# Patient Record
Sex: Female | Born: 1990 | Race: White | Hispanic: Yes | Marital: Single | Smoking: Current every day smoker
Health system: Southern US, Community
[De-identification: ages and names within clinical notes are randomized; demographics above are authoritative.]

---
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
## 2010-12-27 ENCOUNTER — Inpatient Hospital Stay (INDEPENDENT_AMBULATORY_CARE_PROVIDER_SITE_OTHER)
Admission: RE | Admit: 2010-12-27 | Discharge: 2010-12-27 | Disposition: A | Discharge status: Home / Self Care | Payer: Self-pay | Source: Ambulatory Visit | Attending: Family Medicine | Admitting: Family Medicine

## 2010-12-27 DIAGNOSIS — N390 Urinary tract infection, site not specified: Secondary | ICD-10-CM

## 2010-12-27 DIAGNOSIS — F341 Dysthymic disorder: Secondary | ICD-10-CM

## 2010-12-27 LAB — POCT URINALYSIS DIPSTICK
Nitrite: NEGATIVE
Protein, ur: NEGATIVE mg/dL
Specific Gravity, Urine: 1.02 (ref 1.005–1.030)
Urobilinogen, UA: 0.2 mg/dL (ref 0.0–1.0)

## 2011-03-02 NOTE — Discharge Summary (Signed)
Kristen Li, Kristen Li NO.:  [id number]   MEDICAL RECORD NO.:  [id number]          PATIENT TYPE:  INP   LOCATION:  9107                          FACILITY:  [facility]   PHYSICIAN:  Lesly Dukes, M.D. DATE OF BIRTH:  May 20, 1991   DATE OF ADMISSION:  07/25/2006  DATE OF DISCHARGE:  07/27/2006                                 DISCHARGE SUMMARY   ADMISSION DIAGNOSES:  1. A G2, P0, 0-0-1-0 at 37-2 weeks with twin gestation admitted for      induction of labor.  2. Elevated blood pressures.   HOSPITAL COURSE:  Patient was admitted, a 20 year old G2, P0 0-0-1-0 at 37/2  weeks with a twin gestation, vertex breech presentation.  On admission, her  cervical exam was 4-5 cm, 90% and -1.  Blood pressures on admission were in  the upper 130s systolic over 80s-90s diastolic, and PIH labs were drawn.  Creatinine 0.6 is normal.  Alk phos was a little high at 147.  LDH was 159.  Uric acid 6.  CBC showed hemoglobin of 10.9, hematocrit 31.5, platelets 207.   Patient was given an epidural, and a decision was made to start magnesium  sulfate due to her high blood pressures and elevated uric acid.  She was  induced with Pitocin, low dose.  AROM was performed as well.  The patient  delivered on October 11th twins, first via a normal spontaneous vaginal  delivery at 10:49 a.m.  Apgars 9 at one and 9 at five minutes.  Baby B  delivered at 11:00, Apgars 5 and 9.  Placenta was intact.  Two, three vessel  cord.  First degree perineal laceration, which was repaired with 3-0 Vicryl,  with good hemostasis.  EBL was approximately 500 ml.  Fundus was firm at the  time of finishing with patient.  The patient and the babies were stable, and  the cord gas was sent on baby B and was found to be 7.23.   Magnesium was discontinued approximately 24 hours post partum.  She had  great urine output, and no signs or symptoms of preeclampsia.  She had a  normal post-partum course.  Blood pressure at the time  of discharge was  124/76.  Patient is bottle-feeding both of her children.  A prescription was  given for Ortho Tri-Cyclen.  Patient was Rubella immune and O+.  Did not  require RhoGAM or Rubella vaccine.   Social work saw her since she had a teenage pregnancy and felt like her mom  was very supportive, and there was no barrier to discharge anticipated.   Prenatal labs on her [place] include patient is O+, antibody screen  negative, Rubella immune, hepatitis B surface antigen negative, RPR  nonreactive, gonorrhea negative.  Chlamydia was positive but treated in  August.  Discharge hemoglobin and hematocrit on October 12th, hemoglobin  10.9, hematocrit 31.5.  Patient had an RPR that was nonreactive.  Her PIH  panel was done only one time and was recorded in the hospital course above.  The patient was also GBS negative by her previous records.  At the time of discharge, the patient is stable and in good condition.   DISCHARGE DIAGNOSES:  1. Patient is a 20 year old G2, P2, 0-1-2, postpartum day #2 with an      normal spontaneous vaginal delivery for twin #1 and a breech extraction      for twin #2.  2. Elevated blood pressure.  3. Twin gestation.  4. First-degree laceration requiring repair.   Discharge medications include Colace, Ortho Tri-Cyclen, Dermaplast spray,  prenatal vitamins.   INSTRUCTIONS:  Patient is to not have sex for six weeks.  She is to follow  up at [facility] in six weeks for her postpartum appointment.     ______________________________  Alanda Amass, M.D.    ______________________________  Lesly Dukes, M.D.    JH/MEDQ  D:  08/01/2006  T:  08/02/2006  Job:  [id number]
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
## 2011-03-16 ENCOUNTER — Emergency Department (HOSPITAL_COMMUNITY)
Admission: EM | Admit: 2011-03-16 | Discharge: 2011-03-16 | Disposition: A | Discharge status: Home / Self Care | Payer: Self-pay | Attending: Emergency Medicine | Admitting: Emergency Medicine

## 2011-03-16 DIAGNOSIS — K089 Disorder of teeth and supporting structures, unspecified: Secondary | ICD-10-CM | POA: Insufficient documentation

## 2011-03-16 DIAGNOSIS — K029 Dental caries, unspecified: Secondary | ICD-10-CM | POA: Insufficient documentation

## 2011-04-30 ENCOUNTER — Emergency Department (HOSPITAL_COMMUNITY)
Admission: EM | Admit: 2011-04-30 | Discharge: 2011-05-01 | Disposition: A | Discharge status: Home / Self Care | Payer: Self-pay | Attending: Emergency Medicine | Admitting: Emergency Medicine

## 2011-04-30 DIAGNOSIS — T7840XA Allergy, unspecified, initial encounter: Secondary | ICD-10-CM | POA: Insufficient documentation

## 2011-04-30 DIAGNOSIS — I10 Essential (primary) hypertension: Secondary | ICD-10-CM | POA: Insufficient documentation

## 2011-04-30 DIAGNOSIS — R21 Rash and other nonspecific skin eruption: Secondary | ICD-10-CM | POA: Insufficient documentation

## 2011-07-11 LAB — COMPREHENSIVE METABOLIC PANEL
Albumin: 3.3 — ABNORMAL LOW
BUN: 11
Calcium: 9
Creatinine, Ser: 0.84
Total Protein: 6.2

## 2011-07-11 LAB — DIFFERENTIAL
Basophils Absolute: 0.1
Lymphocytes Relative: 38
Lymphs Abs: 1.8
Monocytes Absolute: 0.3
Monocytes Relative: 6
Neutro Abs: 2.4

## 2011-07-11 LAB — CBC
HCT: 38.4
MCV: 83.1
Platelets: 238
RDW: 13.5

## 2011-07-26 LAB — CBC
HCT: 35 — ABNORMAL LOW
Hemoglobin: 12.3
MCHC: 34.5
MCHC: 35.2
MCV: 86.6
RBC: 3.58 — ABNORMAL LOW
RDW: 14.4 — ABNORMAL HIGH

## 2011-07-27 LAB — URINALYSIS, ROUTINE W REFLEX MICROSCOPIC
Hgb urine dipstick: NEGATIVE
Nitrite: NEGATIVE
Specific Gravity, Urine: <1.005 — ABNORMAL LOW
Urobilinogen, UA: 0.2

## 2011-07-27 LAB — WET PREP, GENITAL: Clue Cells Wet Prep HPF POC: NONE SEEN

## 2011-07-27 LAB — URINE MICROSCOPIC-ADD ON

## 2011-07-27 LAB — GC/CHLAMYDIA PROBE AMP, GENITAL: GC Probe Amp, Genital: NEGATIVE

## 2012-05-29 ENCOUNTER — Emergency Department (INDEPENDENT_AMBULATORY_CARE_PROVIDER_SITE_OTHER)
Admission: EM | Admit: 2012-05-29 | Discharge: 2012-05-29 | Disposition: A | Discharge status: Home / Self Care | Payer: Self-pay | Source: Home / Self Care | Attending: Family Medicine | Admitting: Family Medicine

## 2012-05-29 ENCOUNTER — Encounter (HOSPITAL_COMMUNITY): Payer: Self-pay | Admitting: *Deleted

## 2012-05-29 DIAGNOSIS — M7750 Other enthesopathy of unspecified foot: Secondary | ICD-10-CM

## 2012-05-29 DIAGNOSIS — M722 Plantar fascial fibromatosis: Secondary | ICD-10-CM

## 2012-05-29 DIAGNOSIS — M7742 Metatarsalgia, left foot: Secondary | ICD-10-CM

## 2012-05-29 MED ORDER — CYCLOBENZAPRINE HCL 10 MG PO TABS: 10.0000 mg | ORAL_TABLET | Freq: Two times a day (BID) | ORAL | Status: AC | PRN

## 2012-05-29 MED ORDER — PREDNISONE 20 MG PO TABS: ORAL_TABLET | ORAL | Status: AC

## 2012-05-29 MED ORDER — NAPROXEN 500 MG PO TABS: 500.0000 mg | ORAL_TABLET | Freq: Two times a day (BID) | ORAL | Status: DC

## 2012-05-29 MED ORDER — TRAMADOL HCL 50 MG PO TABS: 50.0000 mg | ORAL_TABLET | Freq: Four times a day (QID) | ORAL | Status: AC | PRN

## 2012-05-29 NOTE — ED Provider Notes (Signed)
History     CSN: [id number]  Arrival date & time 05/29/12  1429   First MD Initiated Contact with Patient 05/29/12 1446      Chief Complaint  Patient presents with  . Foot Pain    (Consider location/radiation/quality/duration/timing/severity/associated sxs/prior treatment) HPI Comments: 21 year old female smoker with no significant past medical history here complaining of left foot pain for one week. She works as a Conservation officer, nature at [employer] and has to be standing and walking on concrete floor for most time during the day. Denies recent known injury or falls. Pain is worsening mostly on the anterior sole, throbbing also present in morning when waking up.   History reviewed. No pertinent past medical history.  History reviewed. No pertinent past surgical history.  Family History  Problem Relation Age of Onset  . Family history unknown: Yes    History  Substance Use Topics  . Smoking status: Current Everyday Smoker -- 0.5 packs/day    Types: Cigarettes  . Smokeless tobacco: Not on file  . Alcohol Use: Yes     socially    OB History    Grav Para Term Preterm Abortions TAB SAB Ect Mult Living                  Review of Systems  Constitutional:       10 systems reviewed and  pertinent negative and positive symptoms are as per HPI.     Musculoskeletal:       Left foot pain As per HPI  All other systems reviewed and are negative.    Allergies  Review of patient's allergies indicates no known allergies.  Home Medications   Current Outpatient Rx  Name Route Sig Dispense Refill  . CYCLOBENZAPRINE HCL 10 MG PO TABS Oral Take 1 tablet (10 mg total) by mouth 2 (two) times daily as needed. 20 tablet 0  . NAPROXEN 500 MG PO TABS Oral Take 1 tablet (500 mg total) by mouth 2 (two) times daily with a meal. 30 tablet 0  . PREDNISONE 20 MG PO TABS  2 tabs po daily for 5 days 10 tablet 0  . TRAMADOL HCL 50 MG PO TABS Oral Take 1 tablet (50 mg total) by mouth every 6 (six) hours as  needed for pain. 20 tablet 0    BP 127/85  Pulse 90  Temp 98.9 F (37.2 C) (Oral)  Resp 16  SpO2 98%  LMP 05/19/2012  Physical Exam  Nursing note and vitals reviewed. Constitutional: She is oriented to person, place, and time. She appears well-developed and well-nourished. No distress.  HENT:  Head: Normocephalic and atraumatic.  Eyes: Conjunctivae are normal.  Cardiovascular: Normal heart sounds.   Pulmonary/Chest: Breath sounds normal.  Musculoskeletal:       Left foot: plantar calluses in heels, 1st MPJ and big toe pad. diffused tenderness to palpation with no focalization in metatarsal area and mid and lateral sole anterior to heel. Pain worse with toes flexion. No bruising, swelling, skin abrasions, puncture wounds or lacerations.   Neurological: She is alert and oriented to person, place, and time.    ED Course  Procedures (including critical care time)  Labs Reviewed - No data to display No results found.   1. Metatarsalgia of left foot   2. Plantar fasciitis of left foot       MDM  Treated with tramadol, naproxen, prednisone and Flexeril. Placed on a rigid shoe and crutches. Work note for modified duty. Asked to followup with  sports medicine/orthopedic specialist if worsening symptoms or if not improvement after completing treatment.        Sharin Grave, MD 05/31/12 1520

## 2012-05-29 NOTE — ED Notes (Signed)
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

## 2012-07-22 ENCOUNTER — Encounter (HOSPITAL_COMMUNITY): Payer: Self-pay | Admitting: Physical Medicine and Rehabilitation

## 2012-07-22 ENCOUNTER — Emergency Department (HOSPITAL_COMMUNITY)
Admission: EM | Admit: 2012-07-22 | Discharge: 2012-07-22 | Disposition: A | Discharge status: Home / Self Care | Payer: BC Managed Care – PPO | Attending: Emergency Medicine | Admitting: Emergency Medicine

## 2012-07-22 DIAGNOSIS — M722 Plantar fascial fibromatosis: Secondary | ICD-10-CM | POA: Insufficient documentation

## 2012-07-22 DIAGNOSIS — F17200 Nicotine dependence, unspecified, uncomplicated: Secondary | ICD-10-CM | POA: Insufficient documentation

## 2012-07-22 MED ORDER — IBUPROFEN 800 MG PO TABS: 800.0000 mg | ORAL_TABLET | Freq: Three times a day (TID) | ORAL | Status: DC | PRN

## 2012-07-22 NOTE — ED Notes (Signed)
Pt presents to department for evaluation of L foot pain. Ongoing x3 months. Pt states no relief with medications at home. States pain to bottom of L foot with ambulation. 7/10 at the time. Pt is alert and oriented x4. No signs of distress noted.

## 2012-07-22 NOTE — Progress Notes (Signed)
Orthopedic Tech Progress Note Patient Details:  Kristen Li 10-Dec-1990 [id number] Post. Op shoe applied to Left foot. Patient tolerated well. Family present in room with patient. Ortho Devices Type of Ortho Device: Postop boot Ortho Device/Splint Location: Left LE Ortho Device/Splint Interventions: Application   Asia R Thompson 07/22/2012, 11:18 AM

## 2012-07-22 NOTE — ED Notes (Signed)
Ortho tech paged for ortho boot

## 2012-07-22 NOTE — ED Provider Notes (Signed)
History     CSN: [id number]  Arrival date & time 07/22/12  4098   First MD Initiated Contact with Patient 07/22/12 1015      Chief Complaint  Patient presents with  . Foot Pain    (Consider location/radiation/quality/duration/timing/severity/associated sxs/prior treatment) HPI Comments: Patient reports she has had three months of pain diffusely over the plantar aspect of her left foot.  Pain is described as throbbing, is worse with standing and walking.  Radiates into her left calf.  She was seen in August by [facility] and was diagnosed with plantar fasciitis.  Used a walking boot and crutches for a short period of time.  She believes that the boot helped her pain.  Also used frozen water bottle under her foot which also helped with the pain. Pain is also alleviated by resting the foot.  Denies any weakness of numbness of the foot, fevers, chest pain, SOB.  Denies any injury to her foot.  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  She has changed the kind of shoes she wears at work.  Is currently taking no medication and doing no treatments or stretching for her pain.  States the post-op shoe fell apart.      Patient is a 21 y.o. female presenting with lower extremity pain. The history is provided by the patient.  Foot Pain Pertinent negatives include no numbness or weakness.    No past medical history on file.  No past surgical history on file.  No family history on file.  History  Substance Use Topics  . Smoking status: Current Every Day Smoker -- 0.5 packs/day    Types: Cigarettes  . Smokeless tobacco: Not on file  . Alcohol Use: Yes     socially    OB History    Grav Para Term Preterm Abortions TAB SAB Ect Mult Living                  Review of Systems  Skin: Negative for wound.  Neurological: Negative for weakness and numbness.    Allergies  Review of patient's allergies indicates no known allergies.  Home Medications   Current Outpatient Rx    Name Route Sig Dispense Refill  . NAPROXEN 500 MG PO TABS Oral Take 1 tablet (500 mg total) by mouth 2 (two) times daily with a meal. 30 tablet 0    BP 121/82  Pulse 74  Temp 98.4 F (36.9 C) (Oral)  Resp 16  SpO2 98%  Physical Exam  Nursing note and vitals reviewed. Constitutional: She appears well-developed and well-nourished. No distress.  HENT:  Head: Normocephalic and atraumatic.  Neck: Neck supple.  Pulmonary/Chest: Effort normal.  Musculoskeletal:       Left ankle: She exhibits normal range of motion, no swelling and normal pulse. no tenderness.       Left lower leg: She exhibits no bony tenderness, no swelling and no edema.       Left foot: She exhibits tenderness. She exhibits normal range of motion, no bony tenderness, no swelling, normal capillary refill, no crepitus, no deformity and no laceration.       Left foot- plantar aspect diffusely tender to palpation along plantar fascia.  No erythema, edema, or warmth.  No skin changes.    Neurological: She is alert.  Skin: She is not diaphoretic.    ED Course  Procedures (including critical care time)  Labs Reviewed - No data to display No results found.  1. Plantar fasciitis of left foot     MDM  Pt with several months of pain over the plantar aspect of left foot.  No injury.  No concern for infection or DVT.  Pt previously diagnosed with plantar fasciitis.  No referral given. Pt no longer doing home treatments.  Pt d/c home with post-op boot (which she states helped), NSAIDs, recommendations for exercises, and podiatry follow up.  Discussed diagnosis, care plan, and follow up with patient.  Pt given return precautions.  Pt verbalizes understanding and agrees with plan.           [place], [place] 07/22/12 1540

## 2012-07-22 NOTE — ED Provider Notes (Signed)
Medical screening examination/treatment/procedure(s) were performed by non-physician practitioner and as supervising physician I was immediately available for consultation/collaboration.   Carleene Cooper III, MD 07/22/12 2003

## 2012-11-20 ENCOUNTER — Encounter (HOSPITAL_COMMUNITY): Payer: Self-pay | Admitting: Emergency Medicine

## 2012-11-20 ENCOUNTER — Emergency Department (HOSPITAL_COMMUNITY): Payer: BC Managed Care – PPO

## 2012-11-20 ENCOUNTER — Emergency Department (HOSPITAL_COMMUNITY)
Admission: EM | Admit: 2012-11-20 | Discharge: 2012-11-21 | Disposition: A | Discharge status: Home / Self Care | Payer: BC Managed Care – PPO | Attending: Emergency Medicine | Admitting: Emergency Medicine

## 2012-11-20 DIAGNOSIS — J069 Acute upper respiratory infection, unspecified: Secondary | ICD-10-CM | POA: Insufficient documentation

## 2012-11-20 DIAGNOSIS — R059 Cough, unspecified: Secondary | ICD-10-CM | POA: Insufficient documentation

## 2012-11-20 DIAGNOSIS — F17200 Nicotine dependence, unspecified, uncomplicated: Secondary | ICD-10-CM | POA: Insufficient documentation

## 2012-11-20 DIAGNOSIS — J3489 Other specified disorders of nose and nasal sinuses: Secondary | ICD-10-CM | POA: Insufficient documentation

## 2012-11-20 DIAGNOSIS — R05 Cough: Secondary | ICD-10-CM | POA: Insufficient documentation

## 2012-11-20 MED ORDER — ALBUTEROL SULFATE HFA 108 (90 BASE) MCG/ACT IN AERS
2.0000 | INHALATION_SPRAY | RESPIRATORY_TRACT | Status: DC | PRN
  Administered 2012-11-21: 2 via RESPIRATORY_TRACT
  Filled 2012-11-20: qty 6.7

## 2012-11-20 MED ORDER — IBUPROFEN 800 MG PO TABS
800.0000 mg | ORAL_TABLET | Freq: Once | ORAL | Status: AC
  Administered 2012-11-21: 800 mg via ORAL
  Filled 2012-11-20: qty 1

## 2012-11-20 MED ORDER — IBUPROFEN 800 MG PO TABS: 800.0000 mg | ORAL_TABLET | Freq: Three times a day (TID) | ORAL | Status: DC

## 2012-11-20 MED ORDER — ACETAMINOPHEN-CODEINE 120-12 MG/5ML PO SUSP: 5.0000 mL | Freq: Four times a day (QID) | ORAL | Status: DC | PRN

## 2012-11-20 MED ORDER — ONDANSETRON 4 MG PO TBDP
4.0000 mg | ORAL_TABLET | Freq: Once | ORAL | Status: AC
  Administered 2012-11-21: 4 mg via ORAL
  Filled 2012-11-20: qty 1

## 2012-11-20 MED ORDER — AEROCHAMBER PLUS W/MASK MISC
Status: AC
  Administered 2012-11-21
  Filled 2012-11-20: qty 1

## 2012-11-20 MED ORDER — ACETAMINOPHEN-CODEINE 120-12 MG/5ML PO SOLN
5.0000 mL | Freq: Once | ORAL | Status: AC
  Administered 2012-11-21: 5 mL via ORAL
  Filled 2012-11-20: qty 10

## 2012-11-20 NOTE — ED Notes (Signed)
PT. REPORTS MID CHEST PAIN WITH NASAL CONGESTION / DRY COUGH ONSET TODAY.

## 2012-11-20 NOTE — ED Provider Notes (Addendum)
History     CSN: [id number]  Arrival date & time 11/20/12  2118   First MD Initiated Contact with Patient 11/20/12 2322      Chief Complaint  Patient presents with  . Chest Pain    (Consider location/radiation/quality/duration/timing/severity/associated sxs/prior treatment) HPI Hx per PT, sick x 3 days with cough, cold and congestion.  No fevers, no known sick contacts, no productive sputum. Today developed substernal CP, sharp in quality, not radiating, worse with cough and deep inspiration. No leg pain/ swelling or h/o DVT/ PE. Has not taken anything for cough or pain. No sore throat, rash or HA/ neck pain. MOD in severity.  History reviewed. No pertinent past medical history.  History reviewed. No pertinent past surgical history.  History reviewed. No pertinent family history.  History  Substance Use Topics  . Smoking status: Current Every Day Smoker -- 0.5 packs/day    Types: Cigarettes  . Smokeless tobacco: Not on file  . Alcohol Use: Yes     Comment: socially    OB History    Grav Para Term Preterm Abortions TAB SAB Ect Mult Living                  Review of Systems  Constitutional: Negative for fever and chills.  HENT: Positive for congestion. Negative for neck pain and neck stiffness.   Eyes: Negative for pain.  Respiratory: Positive for cough. Negative for shortness of breath and wheezing.   Cardiovascular: Positive for chest pain. Negative for palpitations and leg swelling.  Gastrointestinal: Negative for abdominal pain.  Genitourinary: Negative for dysuria.  Musculoskeletal: Negative for back pain.  Skin: Negative for rash.  Neurological: Negative for headaches.  All other systems reviewed and are negative.    Allergies  Review of patient's allergies indicates no known allergies.  Home Medications  No current outpatient prescriptions on file.  BP 129/92  Pulse 97  Temp 98 F (36.7 C) (Oral)  Resp 22  SpO2 99%  LMP 11/17/2012  Physical Exam   Constitutional: She is oriented to person, place, and time. She appears well-developed and well-nourished.  HENT:  Head: Normocephalic and atraumatic.  Mouth/Throat: Oropharynx is clear and moist. No oropharyngeal exudate.       Nasal congestion  Eyes: EOM are normal. Pupils are equal, round, and reactive to light.  Neck: Neck supple.  Cardiovascular: Normal rate, regular rhythm and intact distal pulses.   Pulmonary/Chest: Effort normal. No stridor. No respiratory distress. She has no wheezes.       mildly dec bilat breath sounds, reproducible sternal tenderness, no crepitus or rash  Abdominal: Soft. Bowel sounds are normal. She exhibits no distension. There is no tenderness.  Musculoskeletal: Normal range of motion. She exhibits no edema.  Lymphadenopathy:    She has no cervical adenopathy.  Neurological: She is alert and oriented to person, place, and time. No cranial nerve deficit.  Skin: Skin is warm and dry. No rash noted.    ED Course  Procedures (including critical care time)  Labs Reviewed - No data to display Dg Chest 2 View  11/20/2012  *RADIOLOGY REPORT*  Clinical Data: Chest pain and shortness of breath.  CHEST - 2 VIEW  Comparison: None.  Findings: Lungs are clear.  Heart size is normal.  No pneumothorax or pleural effusion.  IMPRESSION: No acute disease.   Original Report Authenticated By: Holley Dexter, M.D.     Date: 11/21/2012  Rate: 90  Rhythm: normal sinus rhythm  QRS Axis: normal  Intervals: normal  ST/T Wave abnormalities: nonspecific ST changes  Conduction Disutrbances:none  Narrative Interpretation:   Old EKG Reviewed: none available   Motrin, tylenol with codeine and albuterol inhaler provided.   URI precautions provided  MDM  Clinical URI in a smoker. No fevers, likely viral, CXR reviewed as above. VS and nursing notes reviewed. Medications and RX provided.        Sunnie Nielsen, MD 11/20/12 2130  Sunnie Nielsen, MD 11/21/12 [phone]

## 2013-10-28 ENCOUNTER — Emergency Department (HOSPITAL_COMMUNITY): Payer: BC Managed Care – PPO

## 2013-10-28 ENCOUNTER — Encounter (HOSPITAL_COMMUNITY): Payer: Self-pay | Admitting: Emergency Medicine

## 2013-10-28 ENCOUNTER — Emergency Department (HOSPITAL_COMMUNITY)
Admission: EM | Admit: 2013-10-28 | Discharge: 2013-10-28 | Disposition: A | Discharge status: Home / Self Care | Payer: BC Managed Care – PPO | Attending: Emergency Medicine | Admitting: Emergency Medicine

## 2013-10-28 DIAGNOSIS — Z791 Long term (current) use of non-steroidal anti-inflammatories (NSAID): Secondary | ICD-10-CM | POA: Insufficient documentation

## 2013-10-28 DIAGNOSIS — IMO0001 Reserved for inherently not codable concepts without codable children: Secondary | ICD-10-CM | POA: Insufficient documentation

## 2013-10-28 DIAGNOSIS — R51 Headache: Secondary | ICD-10-CM | POA: Insufficient documentation

## 2013-10-28 DIAGNOSIS — F17200 Nicotine dependence, unspecified, uncomplicated: Secondary | ICD-10-CM | POA: Insufficient documentation

## 2013-10-28 DIAGNOSIS — J069 Acute upper respiratory infection, unspecified: Secondary | ICD-10-CM

## 2013-10-28 MED ORDER — DEXTROMETHORPHAN POLISTIREX 30 MG/5ML PO LQCR: 15.0000 mg | Freq: Two times a day (BID) | ORAL | Status: DC

## 2013-10-28 NOTE — ED Provider Notes (Signed)
CSN: [id number]     Arrival date & time 10/28/13  1920 History   None    Chief Complaint  Patient presents with  . URI  . Generalized Body Aches   (Consider location/radiation/quality/duration/timing/severity/associated sxs/prior Treatment) HPI Comments: Patient with URI, symptoms, consisting of nasal congestion, postnasal drip, rhinorrhea, myalgias, states, that yesterday.  She lost her voice, and developed headache, and body aches.  Denies any fever, nausea, vomiting, dysuria. Patient states she is drinking plenty of fluids and eating well.  Cough is disturbing her sleep  Patient is a 23 y.o. female presenting with URI.  URI Presenting symptoms: congestion, cough and rhinorrhea   Presenting symptoms: no fever and no sore throat   Severity:  Mild Timing:  Constant Progression:  Worsening Chronicity:  New Relieved by:  OTC medications Worsened by:  Nothing tried Associated symptoms: headaches and myalgias   Associated symptoms: no sinus pain and no sneezing     History reviewed. No pertinent past medical history. History reviewed. No pertinent past surgical history. History reviewed. No pertinent family history. History  Substance Use Topics  . Smoking status: Current Every Day Smoker -- 0.50 packs/day    Types: Cigarettes  . Smokeless tobacco: Not on file  . Alcohol Use: Yes     Comment: socially   OB History   Grav Para Term Preterm Abortions TAB SAB Ect Mult Living                 Review of Systems  Constitutional: Negative for fever.  HENT: Positive for congestion and rhinorrhea. Negative for sneezing and sore throat.   Respiratory: Positive for cough.   Musculoskeletal: Positive for myalgias.  Neurological: Positive for headaches. Negative for dizziness.    Allergies  Review of patient's allergies indicates no known allergies.  Home Medications   Current Outpatient Rx  Name  Route  Sig  Dispense  Refill  . acetaminophen-codeine 120-12 MG/5ML suspension  Oral   Take 5 mLs by mouth every 6 (six) hours as needed for pain.   60 mL   0   . dextromethorphan (DELSYM) 30 MG/5ML liquid   Oral   Take 2.5 mLs (15 mg total) by mouth 2 (two) times daily.   89 mL   0   . ibuprofen (ADVIL,MOTRIN) 800 MG tablet   Oral   Take 1 tablet (800 mg total) by mouth 3 (three) times daily.   21 tablet   0    BP 127/69  Pulse 90  Temp(Src) 98.6 F (37 C) (Oral)  Resp 20  Wt 169 lb 4.8 oz (76.794 kg)  SpO2 100%  LMP 10/15/2013 Physical Exam  Vitals reviewed. Constitutional: She is oriented to person, place, and time. She appears well-developed and well-nourished.  HENT:  Head: Normocephalic.  Right Ear: External ear normal.  Left Ear: External ear normal.  Mouth/Throat: Oropharynx is clear and moist.  Eyes: Pupils are equal, round, and reactive to light.  Neck: Normal range of motion.  Cardiovascular: Normal rate.   Pulmonary/Chest: Effort normal and breath sounds normal. She exhibits no tenderness.  Lymphadenopathy:    She has no cervical adenopathy.  Neurological: She is alert and oriented to person, place, and time.  Skin: Skin is warm and dry.    ED Course  Procedures (including critical care time) Labs Review Labs Reviewed - No data to display Imaging Review Dg Chest 2 View  10/28/2013   CLINICAL DATA:  Chest pain  EXAM: CHEST  2 VIEW  COMPARISON:  November 20, 2012  FINDINGS: Lungs are clear. Heart size and pulmonary vascularity are normal. No adenopathy. No bone lesions. No pneumothorax.  IMPRESSION: No abnormality noted.   Electronically Signed   By: Bretta BangWilliam  Woodruff M.D.   On: 10/28/2013 19:56    EKG Interpretation   None       MDM   1. URI, acute         Arman FilterGail K Levelle Edelen, NP 10/28/13 [id number]

## 2013-10-28 NOTE — ED Provider Notes (Signed)
Medical screening examination/treatment/procedure(s) were performed by non-physician practitioner and as supervising physician I was immediately available for consultation/collaboration.     Laquincy Eastridge, MD 10/28/13 2307 

## 2013-10-28 NOTE — ED Notes (Signed)
Pt in c/o cough, congestion and generalized body aches x1 week, denies fever at home

## 2013-10-28 NOTE — Discharge Instructions (Signed)
Cough, Adult  A cough is a reflex. It helps you clear your throat and airways. A cough can help heal your body. A cough can last 2 or 3 weeks (acute) or may last more than 8 weeks (chronic). Some common causes of a cough can include an infection, allergy, or a cold. HOME CARE  Only take medicine as told by your doctor.  If given, take your medicines (antibiotics) as told. Finish them even if you start to feel better.  Use a cold steam vaporizer or humidier in your home. This can help loosen thick spit (secretions).  Sleep so you are almost sitting up (semi-upright). Use pillows to do this. This helps reduce coughing.  Rest as needed.  Stop smoking if you smoke. GET HELP RIGHT AWAY IF:  You have yellowish-white fluid (pus) in your thick spit.  Your cough gets worse.  Your medicine does not reduce coughing, and you are losing sleep.  You cough up blood.  You have trouble breathing.  Your pain gets worse and medicine does not help.  You have a fever. MAKE SURE YOU:   Understand these instructions.  Will watch your condition.  Will get help right away if you are not doing well or get worse. Document Released: 06/14/2011 Document Revised: 12/24/2011 Document Reviewed: 06/14/2011 [facility] Patient Information 2014 [place] [employer] [place]. You have been given a prescription for Delsym to help control your cough, please take this as directed Continue taking alternating doses of Tylenol, and ibuprofen every 4 to 5 hours to control your body aches

## 2014-07-06 ENCOUNTER — Emergency Department (INDEPENDENT_AMBULATORY_CARE_PROVIDER_SITE_OTHER)
Admission: EM | Admit: 2014-07-06 | Discharge: 2014-07-06 | Disposition: A | Discharge status: Home / Self Care | Payer: BC Managed Care – PPO | Source: Home / Self Care | Attending: Family Medicine | Admitting: Family Medicine

## 2014-07-06 ENCOUNTER — Encounter (HOSPITAL_COMMUNITY): Payer: Self-pay | Admitting: Emergency Medicine

## 2014-07-06 DIAGNOSIS — J302 Other seasonal allergic rhinitis: Secondary | ICD-10-CM

## 2014-07-06 DIAGNOSIS — Z720 Tobacco use: Secondary | ICD-10-CM

## 2014-07-06 DIAGNOSIS — J40 Bronchitis, not specified as acute or chronic: Principal | ICD-10-CM

## 2014-07-06 DIAGNOSIS — J410 Simple chronic bronchitis: Secondary | ICD-10-CM

## 2014-07-06 DIAGNOSIS — J309 Allergic rhinitis, unspecified: Secondary | ICD-10-CM

## 2014-07-06 MED ORDER — CETIRIZINE HCL 10 MG PO TABS: 10.0000 mg | ORAL_TABLET | Freq: Every day | ORAL | Status: DC

## 2014-07-06 MED ORDER — FLUTICASONE PROPIONATE 50 MCG/ACT NA SUSP: 1.0000 | Freq: Two times a day (BID) | NASAL | Status: DC

## 2014-07-06 MED ORDER — MINOCYCLINE HCL 100 MG PO CAPS: 100.0000 mg | ORAL_CAPSULE | Freq: Two times a day (BID) | ORAL | Status: DC

## 2014-07-06 NOTE — ED Provider Notes (Signed)
CSN: [id number]     Arrival date & time 07/06/14  0914 History   First MD Initiated Contact with Patient 07/06/14 0935     No chief complaint on file.  (Consider location/radiation/quality/duration/timing/severity/associated sxs/prior Treatment) Patient is a 23 y.o. female presenting with cough. The history is provided by the patient.  Cough Cough characteristics:  Productive Sputum characteristics:  Yellow Severity:  Mild Onset quality:  Gradual Duration:  3 days Progression:  Worsening Chronicity:  New Smoker: yes   Context: smoke exposure and upper respiratory infection   Ineffective treatments:  Cough suppressants Associated symptoms: rhinorrhea and sinus congestion   Associated symptoms: no chills, no fever and no wheezing     No past medical history on file. No past surgical history on file. No family history on file. History  Substance Use Topics  . Smoking status: Current Every Day Smoker -- 0.50 packs/day    Types: Cigarettes  . Smokeless tobacco: Not on file  . Alcohol Use: Yes     Comment: socially   OB History   Grav Para Term Preterm Abortions TAB SAB Ect Mult Living                 Review of Systems  Constitutional: Negative.  Negative for fever and chills.  HENT: Positive for congestion, postnasal drip and rhinorrhea.   Respiratory: Positive for cough. Negative for wheezing.   Cardiovascular: Negative.   Gastrointestinal: Negative.     Allergies  Review of patient's allergies indicates no known allergies.  Home Medications   Prior to Admission medications   Medication Sig Start Date End Date Taking? Authorizing Provider  acetaminophen-codeine 120-12 MG/5ML suspension Take 5 mLs by mouth every 6 (six) hours as needed for pain. 11/20/12   Sunnie Nielsen, MD  cetirizine (ZYRTEC) 10 MG tablet Take 1 tablet (10 mg total) by mouth daily. 07/06/14   Linna Hoff, MD  dextromethorphan (DELSYM) 30 MG/5ML liquid Take 2.5 mLs (15 mg total) by mouth 2 (two) times  daily. 10/28/13   Arman Filter, NP  fluticasone (FLONASE) 50 MCG/ACT nasal spray Place 1 spray into both nostrils 2 (two) times daily. 07/06/14   Linna Hoff, MD  ibuprofen (ADVIL,MOTRIN) 800 MG tablet Take 1 tablet (800 mg total) by mouth 3 (three) times daily. 11/20/12   Sunnie Nielsen, MD  minocycline (MINOCIN,DYNACIN) 100 MG capsule Take 1 capsule (100 mg total) by mouth 2 (two) times daily. 07/06/14   Linna Hoff, MD   BP 118/86  Pulse 72  Temp(Src) 97.1 F (36.2 C) (Oral)  Resp 16  SpO2 98% Physical Exam  Nursing note and vitals reviewed. Constitutional: She is oriented to person, place, and time. She appears well-developed and well-nourished. No distress.  HENT:  Right Ear: External ear normal.  Left Ear: External ear normal.  Mouth/Throat: Oropharynx is clear and moist.  Eyes: Conjunctivae are normal. Pupils are equal, round, and reactive to light.  Neck: Normal range of motion. Neck supple.  Pulmonary/Chest: Effort normal and breath sounds normal.  Lymphadenopathy:    She has no cervical adenopathy.  Neurological: She is alert and oriented to person, place, and time.  Skin: Skin is warm and dry.    ED Course  Procedures (including critical care time) Labs Review Labs Reviewed - No data to display  Imaging Review No results found.   MDM   1. Bronchitis due to tobacco use   2. Seasonal allergic rhinitis        Quita Skye  Francies Inch, MD 07/06/14 1018

## 2014-07-06 NOTE — Discharge Instructions (Signed)
Take all of medicine, drink lots of fluids, no more smoking, see your doctor if further problems  °

## 2014-07-06 NOTE — ED Notes (Signed)
Symptoms     Of  Cough  Congestion        Sinus  Drainage         With  Pain    For      3  Days       Pt in  no  Distress    Sitting  Upright      In no  Distress        Speaking       In      Complete

## 2015-02-04 ENCOUNTER — Encounter (HOSPITAL_COMMUNITY): Payer: Self-pay | Admitting: Emergency Medicine

## 2015-02-04 ENCOUNTER — Emergency Department (HOSPITAL_COMMUNITY)
Admission: EM | Admit: 2015-02-04 | Discharge: 2015-02-04 | Disposition: A | Discharge status: Home / Self Care | Payer: BLUE CROSS/BLUE SHIELD | Attending: Emergency Medicine | Admitting: Emergency Medicine

## 2015-02-04 DIAGNOSIS — Z720 Tobacco use: Secondary | ICD-10-CM | POA: Diagnosis not present

## 2015-02-04 DIAGNOSIS — Z7951 Long term (current) use of inhaled steroids: Secondary | ICD-10-CM | POA: Diagnosis not present

## 2015-02-04 DIAGNOSIS — L500 Allergic urticaria: Secondary | ICD-10-CM | POA: Diagnosis not present

## 2015-02-04 DIAGNOSIS — Z79899 Other long term (current) drug therapy: Secondary | ICD-10-CM | POA: Insufficient documentation

## 2015-02-04 DIAGNOSIS — L509 Urticaria, unspecified: Secondary | ICD-10-CM

## 2015-02-04 DIAGNOSIS — R21 Rash and other nonspecific skin eruption: Secondary | ICD-10-CM | POA: Diagnosis present

## 2015-02-04 MED ORDER — FAMOTIDINE 20 MG PO TABS: 20.0000 mg | ORAL_TABLET | Freq: Two times a day (BID) | ORAL | Status: DC

## 2015-02-04 MED ORDER — DIPHENHYDRAMINE HCL 50 MG/ML IJ SOLN
25.0000 mg | Freq: Once | INTRAMUSCULAR | Status: AC
  Administered 2015-02-04: 25 mg via INTRAVENOUS
  Filled 2015-02-04: qty 1

## 2015-02-04 MED ORDER — METHYLPREDNISOLONE SODIUM SUCC 125 MG IJ SOLR
125.0000 mg | Freq: Once | INTRAMUSCULAR | Status: AC
  Administered 2015-02-04: 125 mg via INTRAVENOUS
  Filled 2015-02-04: qty 2

## 2015-02-04 MED ORDER — FAMOTIDINE IN NACL 20-0.9 MG/50ML-% IV SOLN
20.0000 mg | Freq: Once | INTRAVENOUS | Status: AC
Start: 2015-02-04 — End: 2015-02-04
  Administered 2015-02-04: 20 mg via INTRAVENOUS
  Filled 2015-02-04: qty 50

## 2015-02-04 MED ORDER — PREDNISONE 50 MG PO TABS: ORAL_TABLET | ORAL | Status: DC

## 2015-02-04 MED ORDER — DIPHENHYDRAMINE HCL 25 MG PO TABS: 50.0000 mg | ORAL_TABLET | ORAL | Status: DC | PRN

## 2015-02-04 MED ORDER — SODIUM CHLORIDE 0.9 % IV BOLUS (SEPSIS)
1000.0000 mL | Freq: Once | INTRAVENOUS | Status: AC
  Administered 2015-02-04: 1000 mL via INTRAVENOUS

## 2015-02-04 NOTE — ED Notes (Signed)
Patient is alert and orientedx4.  Patient was explained discharge instructions and they understood them with no questions.  The patient's significant other, Providence CrosbyGloria Gonzales is taking the patient home.

## 2015-02-04 NOTE — Discharge Instructions (Signed)
Please follow with your primary care doctor in the next 2 days for a check-up. They must obtain records for further management.  ° °Do not hesitate to return to the Emergency Department for any new, worsening or concerning symptoms.  ° °

## 2015-02-04 NOTE — ED Notes (Signed)
Pt st's she started having allergic reaction yesterday.  C/O itching all over, does not know what she is allergic to

## 2015-02-04 NOTE — ED Provider Notes (Signed)
CSN: [id number]     Arrival date & time 02/04/15  1613 History  This chart was scribed for non-physician practitioner Wynetta EmeryNicole Berea Majkowski, PA working with Margarita Grizzleanielle Ray, MD, by Tanda RockersMargaux Venter, ED Scribe. This patient was seen in room TR03C/TR03C and the patient's care was started at 5:36 PM.      Chief Complaint  Patient presents with  . Allergic Reaction   The history is provided by the patient. No language interpreter was used.     HPI Comments: Kristen Li is a 24 y.o. female who presents to the Emergency Department complaining of allergic reaction that began 1 day ago. She states that she was at work yesterday and began having a sudden onset of itching and rash to upper extremities, lower extremities, chest, and back. She states that when she isn't scratching and aren't so anxious, then she has pain to the rash areas.  She mentions that she was unable to sleep due to the itching. Pt took Benadryl, Cortisone cream, and Tylenol with no relief.  She deniesnausea, vomiting, dyspepsia, shortness of breath, lip or tongue swelling, change in soaps, lotion, laundry detergent, medicine, or pets.   History reviewed. No pertinent past medical history. History reviewed. No pertinent past surgical history. No family history on file. History  Substance Use Topics  . Smoking status: Current Every Day Smoker -- 0.50 packs/day    Types: Cigarettes  . Smokeless tobacco: Not on file  . Alcohol Use: Yes     Comment: socially   OB History    No data available     Review of Systems  A complete 10 system review of systems was obtained and all systems are negative except as noted in the HPI and PMH.    Allergies  Review of patient's allergies indicates no known allergies.  Home Medications   Prior to Admission medications   Medication Sig Start Date End Date Taking? Authorizing Provider  acetaminophen-codeine 120-12 MG/5ML suspension Take 5 mLs by mouth every 6 (six) hours as needed for pain. 11/20/12    Sunnie NielsenBrian Opitz, MD  cetirizine (ZYRTEC) 10 MG tablet Take 1 tablet (10 mg total) by mouth daily. 07/06/14   Linna HoffJames D Kindl, MD  dextromethorphan (DELSYM) 30 MG/5ML liquid Take 2.5 mLs (15 mg total) by mouth 2 (two) times daily. 10/28/13   Earley FavorGail Schulz, NP  fluticasone (FLONASE) 50 MCG/ACT nasal spray Place 1 spray into both nostrils 2 (two) times daily. 07/06/14   Linna HoffJames D Kindl, MD  ibuprofen (ADVIL,MOTRIN) 800 MG tablet Take 1 tablet (800 mg total) by mouth 3 (three) times daily. 11/20/12   Sunnie NielsenBrian Opitz, MD  minocycline (MINOCIN,DYNACIN) 100 MG capsule Take 1 capsule (100 mg total) by mouth 2 (two) times daily. 07/06/14   Linna HoffJames D Kindl, MD   Triage Vitals: BP 129/81 mmHg  Pulse 86  Temp(Src) 98.3 F (36.8 C) (Oral)  Resp 15  SpO2 100%  LMP 01/27/2015   Physical Exam  Constitutional: She is oriented to person, place, and time. She appears well-developed and well-nourished. No distress.  HENT:  Head: Normocephalic and atraumatic.  Mouth/Throat: Oropharynx is clear and moist.  Eyes: Conjunctivae and EOM are normal. Pupils are equal, round, and reactive to light.  Neck: Normal range of motion. Neck supple. No tracheal deviation present.  Cardiovascular: Normal rate, regular rhythm and intact distal pulses.   Pulmonary/Chest: Effort normal and breath sounds normal. No respiratory distress.  No stridor or drooling. No posterior pharynx edema, lip or tongue swelling. Pt reclining comfortably,  speaking in complete sentences.   No Wheezing, excellent air movement in all fields.     Abdominal: Soft. There is no tenderness.  Musculoskeletal: Normal range of motion.  Neurological: She is alert and oriented to person, place, and time.  Skin: Skin is warm and dry. She is not diaphoretic.  Scattered hives 4 extremities and all along the torso. Lesions spare palms soles and mucous membranes.  Psychiatric: She has a normal mood and affect. Her behavior is normal.  Nursing note and vitals reviewed.   ED  Course  Procedures (including critical care time)  DIAGNOSTIC STUDIES: Oxygen Saturation is 100% on RA, normal by my interpretation.    COORDINATION OF CARE: 5:40 PM-Discussed treatment plan which includes IV Benadryl and Pepcid with pt at bedside and pt agreed to plan.   Labs Review Labs Reviewed - No data to display  Imaging Review No results found.   EKG Interpretation None      MDM   Final diagnoses:  Hives    Filed Vitals:   02/04/15 1635 02/04/15 1847 02/04/15 1935  BP: 129/81 111/64 109/56  Pulse: 86 65 71  Temp: 98.3 F (36.8 C)  98.3 F (36.8 C)  TempSrc: Oral  Oral  Resp: SpO2: 100% 99% 97%    Medications  sodium chloride 0.9 % bolus 1,000 mL (0 mLs Intravenous Stopped 02/04/15 1936)  methylPREDNISolone sodium succinate (SOLU-MEDROL) 125 mg/2 mL injection 125 mg (125 mg Intravenous Given 02/04/15 1759)  diphenhydrAMINE (BENADRYL) injection 25 mg (25 mg Intravenous Given 02/04/15 1759)  famotidine (PEPCID) IVPB 20 mg (0 mg Intravenous Stopped 02/04/15 1846)    Kristen Li is a pleasant 24 y.o. female presenting with hives. Does not meet criteria for anaphylaxis and epinephrine administration: no multisystem involvement, airway compromise or hypotension. Pt given IVF, decadron, benadryl and pepcid with significant improvement in ED. Discharged with meds for symptom control and referral for allergy testing.   Evaluation does not show pathology that would require ongoing emergent intervention or inpatient treatment. Pt is hemodynamically stable and mentating appropriately. Discussed findings and plan with patient/guardian, who agrees with care plan. All questions answered. Return precautions discussed and outpatient follow up given.   Discharge Medication List as of 02/04/2015  7:09 PM    START taking these medications   Details  diphenhydrAMINE (BENADRYL) 25 MG tablet Take 2 tablets (50 mg total) by mouth every 4 (four) hours as needed for itching.,  Starting 02/04/2015, Until Discontinued, Print    famotidine (PEPCID) 20 MG tablet Take 1 tablet (20 mg total) by mouth 2 (two) times daily., Starting 02/04/2015, Until Discontinued, Print    predniSONE (DELTASONE) 50 MG tablet Take 1 tablet daily with breakfast, Print         I personally performed the services described in this documentation, which was scribed in my presence. The recorded information has been reviewed and is accurate.      Joni Reining Sadira Standard, PA-C 02/04/15 1610  Margarita Grizzle, MD 02/05/15 [phone]

## 2015-05-14 ENCOUNTER — Encounter (HOSPITAL_COMMUNITY): Payer: Self-pay | Admitting: *Deleted

## 2015-05-14 ENCOUNTER — Emergency Department (INDEPENDENT_AMBULATORY_CARE_PROVIDER_SITE_OTHER)
Admission: EM | Admit: 2015-05-14 | Discharge: 2015-05-14 | Disposition: A | Discharge status: Home / Self Care | Payer: BLUE CROSS/BLUE SHIELD | Source: Home / Self Care | Attending: Family Medicine | Admitting: Family Medicine

## 2015-05-14 DIAGNOSIS — J069 Acute upper respiratory infection, unspecified: Secondary | ICD-10-CM

## 2015-05-14 MED ORDER — IPRATROPIUM BROMIDE 0.06 % NA SOLN: 2.0000 | Freq: Four times a day (QID) | NASAL | Status: DC

## 2015-05-14 MED ORDER — AZITHROMYCIN 250 MG PO TABS: ORAL_TABLET | ORAL | Status: DC

## 2015-05-14 NOTE — Discharge Instructions (Signed)
Drink plenty of fluids as discussed, use medicine as prescribed, and mucinex or delsym for cough. Return or see your doctor if further problems, try to quit smoking if you want to get well.

## 2015-05-14 NOTE — ED Provider Notes (Signed)
CSN: [id number]     Arrival date & time 05/14/15  1827 History   First MD Initiated Contact with Patient 05/14/15 1947     Chief Complaint  Patient presents with  . URI   (Consider location/radiation/quality/duration/timing/severity/associated sxs/prior Treatment) Patient is a 24 y.o. female presenting with URI. The history is provided by the patient.  URI Presenting symptoms: congestion, cough and rhinorrhea   Presenting symptoms: no fever and no sore throat   Severity:  Mild Onset quality:  Gradual Duration:  2 days Progression:  Unchanged Chronicity:  New Relieved by:  None tried Worsened by:  Nothing tried Ineffective treatments:  None tried Associated symptoms: no wheezing   Risk factors comment:  Smoker   History reviewed. No pertinent past medical history. History reviewed. No pertinent past surgical history. History reviewed. No pertinent family history. History  Substance Use Topics  . Smoking status: Current Every Day Smoker -- 0.50 packs/day    Types: Cigarettes  . Smokeless tobacco: Not on file  . Alcohol Use: Yes     Comment: socially   OB History    No data available     Review of Systems  Constitutional: Negative.  Negative for fever.  HENT: Positive for congestion, postnasal drip and rhinorrhea. Negative for sore throat.   Respiratory: Positive for cough. Negative for shortness of breath and wheezing.     Allergies  Review of patient's allergies indicates no known allergies.  Home Medications   Prior to Admission medications   Medication Sig Start Date End Date Taking? Authorizing Provider  acetaminophen-codeine 120-12 MG/5ML suspension Take 5 mLs by mouth every 6 (six) hours as needed for pain. 11/20/12   Sunnie Nielsen, MD  azithromycin (ZITHROMAX Z-PAK) 250 MG tablet Take as directed on pack 05/14/15   Linna Hoff, MD  cetirizine (ZYRTEC) 10 MG tablet Take 1 tablet (10 mg total) by mouth daily. 07/06/14   Linna Hoff, MD  dextromethorphan  (DELSYM) 30 MG/5ML liquid Take 2.5 mLs (15 mg total) by mouth 2 (two) times daily. 10/28/13   Earley Favor, NP  diphenhydrAMINE (BENADRYL) 25 MG tablet Take 2 tablets (50 mg total) by mouth every 4 (four) hours as needed for itching. 02/04/15   Joni Reining Pisciotta, PA-C  famotidine (PEPCID) 20 MG tablet Take 1 tablet (20 mg total) by mouth 2 (two) times daily. 02/04/15   Nicole Pisciotta, PA-C  fluticasone (FLONASE) 50 MCG/ACT nasal spray Place 1 spray into both nostrils 2 (two) times daily. 07/06/14   Linna Hoff, MD  ibuprofen (ADVIL,MOTRIN) 800 MG tablet Take 1 tablet (800 mg total) by mouth 3 (three) times daily. 11/20/12   Sunnie Nielsen, MD  ipratropium (ATROVENT) 0.06 % nasal spray Place 2 sprays into both nostrils 4 (four) times daily. 05/14/15   Linna Hoff, MD  minocycline (MINOCIN,DYNACIN) 100 MG capsule Take 1 capsule (100 mg total) by mouth 2 (two) times daily. 07/06/14   Linna Hoff, MD  predniSONE (DELTASONE) 50 MG tablet Take 1 tablet daily with breakfast 02/04/15   Joni Reining Pisciotta, PA-C   BP 110/79 mmHg  Pulse 90  Temp(Src) 98.2 F (36.8 C) (Oral)  Resp 18  SpO2 100%  LMP 04/14/2015 Physical Exam  Constitutional: She is oriented to person, place, and time. She appears well-developed and well-nourished. No distress.  HENT:  Head: Normocephalic.  Right Ear: External ear normal.  Left Ear: External ear normal.  Nose: Mucosal edema and rhinorrhea present.  Mouth/Throat: Oropharynx is clear and moist.  Eyes:  Pupils are equal, round, and reactive to light.  Neck: Normal range of motion. Neck supple.  Lymphadenopathy:    She has no cervical adenopathy.  Neurological: She is alert and oriented to person, place, and time.  Skin: Skin is warm and dry.  Nursing note and vitals reviewed.   ED Course  Procedures (including critical care time) Labs Review Labs Reviewed - No data to display  Imaging Review No results found.   MDM   1. URI (upper respiratory infection)         Linna Hoff, MD 05/14/15 [phone]

## 2015-05-14 NOTE — ED Notes (Signed)
Cough /  Congested  For  sev  Days   -  Symptoms   Not  releived  By  otc  meds

## 2015-09-28 ENCOUNTER — Encounter (HOSPITAL_COMMUNITY): Payer: Self-pay | Admitting: Emergency Medicine

## 2015-09-28 ENCOUNTER — Emergency Department (HOSPITAL_COMMUNITY)
Admission: EM | Admit: 2015-09-28 | Discharge: 2015-09-28 | Disposition: A | Discharge status: Home / Self Care | Payer: BLUE CROSS/BLUE SHIELD | Source: Home / Self Care

## 2015-09-28 DIAGNOSIS — K047 Periapical abscess without sinus: Secondary | ICD-10-CM

## 2015-09-28 MED ORDER — HYDROCODONE-ACETAMINOPHEN 5-325 MG PO TABS
1.0000 | ORAL_TABLET | Freq: Four times a day (QID) | ORAL | Status: DC | PRN
Start: 2015-09-28 — End: 2016-03-08

## 2015-09-28 MED ORDER — AMOXICILLIN 500 MG PO CAPS: 500.0000 mg | ORAL_CAPSULE | Freq: Three times a day (TID) | ORAL | Status: DC

## 2015-09-28 NOTE — Discharge Instructions (Signed)

## 2015-09-28 NOTE — ED Provider Notes (Signed)
CSN: [id number]     Arrival date & time 09/28/15  1937 History   None    Chief Complaint  Patient presents with  . Dental Pain   (Consider location/radiation/quality/duration/timing/severity/associated sxs/prior Treatment) HPI History obtained from patient:   LOCATION: right jaw SEVERITY:7 DURATION:today CONTEXT:sudden onset after working outside QUALITY:like previous tooth aches MODIFYING FACTORS: tyelnol/ibuprofen ASSOCIATED SYMPTOMS: pain into right ear TIMING:constant OCCUPATION:[employer]  History reviewed. No pertinent past medical history. History reviewed. No pertinent past surgical history. History reviewed. No pertinent family history. Social History  Substance Use Topics  . Smoking status: Current Every Day Smoker -- 0.50 packs/day    Types: Cigarettes  . Smokeless tobacco: None  . Alcohol Use: Yes     Comment: socially   OB History    No data available     Review of Systems ROS +'vedental pain  Denies: HEADACHE, NAUSEA, ABDOMINAL PAIN, CHEST PAIN, CONGESTION, DYSURIA, SHORTNESS OF BREATH  Allergies  Review of patient's allergies indicates no known allergies.  Home Medications   Prior to Admission medications   Medication Sig Start Date End Date Taking? Authorizing Provider  acetaminophen-codeine 120-12 MG/5ML suspension Take 5 mLs by mouth every 6 (six) hours as needed for pain. 11/20/12   Sunnie Nielsen, MD  amoxicillin (AMOXIL) 500 MG capsule Take 1 capsule (500 mg total) by mouth 3 (three) times daily. 09/28/15   Tharon Aquas, PA  azithromycin (ZITHROMAX Z-PAK) 250 MG tablet Take as directed on pack 05/14/15   Linna Hoff, MD  cetirizine (ZYRTEC) 10 MG tablet Take 1 tablet (10 mg total) by mouth daily. 07/06/14   Linna Hoff, MD  dextromethorphan (DELSYM) 30 MG/5ML liquid Take 2.5 mLs (15 mg total) by mouth 2 (two) times daily. 10/28/13   Earley Favor, NP  diphenhydrAMINE (BENADRYL) 25 MG tablet Take 2 tablets (50 mg total) by mouth every 4 (four)  hours as needed for itching. 02/04/15   Joni Reining Pisciotta, PA-C  famotidine (PEPCID) 20 MG tablet Take 1 tablet (20 mg total) by mouth 2 (two) times daily. 02/04/15   Nicole Pisciotta, PA-C  fluticasone (FLONASE) 50 MCG/ACT nasal spray Place 1 spray into both nostrils 2 (two) times daily. 07/06/14   Linna Hoff, MD  HYDROcodone-acetaminophen (NORCO/VICODIN) 5-325 MG tablet Take 1 tablet by mouth every 6 (six) hours as needed. 09/28/15   Tharon Aquas, PA  ibuprofen (ADVIL,MOTRIN) 800 MG tablet Take 1 tablet (800 mg total) by mouth 3 (three) times daily. 11/20/12   Sunnie Nielsen, MD  ipratropium (ATROVENT) 0.06 % nasal spray Place 2 sprays into both nostrils 4 (four) times daily. 05/14/15   Linna Hoff, MD  minocycline (MINOCIN,DYNACIN) 100 MG capsule Take 1 capsule (100 mg total) by mouth 2 (two) times daily. 07/06/14   Linna Hoff, MD  predniSONE (DELTASONE) 50 MG tablet Take 1 tablet daily with breakfast 02/04/15   Wynetta Emery, PA-C   Meds Ordered and Administered this Visit  Medications - No data to display  BP 137/95 mmHg  Pulse 81  Temp(Src) 99 F (37.2 C) (Oral)  Resp 20  SpO2 99%  LMP 09/28/2015 (Exact Date) No data found.   Physical Exam  Constitutional: She is oriented to person, place, and time. She appears well-developed and well-nourished. No distress.  HENT:  Head: Normocephalic and atraumatic.  Right Ear: External ear normal.  Mouth/Throat: Oropharynx is clear and moist. Dental caries present.    Neck: Normal range of motion. Neck supple.  Pulmonary/Chest: Effort normal.  Neurological: She is alert and oriented to person, place, and time.  Skin: Skin is warm and dry.  Psychiatric: She has a normal mood and affect. Her behavior is normal. Judgment and thought content normal.  Nursing note and vitals reviewed.   ED Course  Procedures (including critical care time)  Labs Review Labs Reviewed - No data to display  Imaging Review No results found.   Visual  Acuity Review  Right Eye Distance:   Left Eye Distance:   Bilateral Distance:    Right Eye Near:   Left Eye Near:    Bilateral Near:         MDM   1. Dental infection    Rx: amoxil, norco Instructions provided, discharged home in stable condition.   THIS NOTE WAS GENERATED USING A VOICE RECOGNITION SOFTWARE PROGRAM. ALL REASONABLE EFFORTS  WERE MADE TO PROOFREAD THIS DOCUMENT FOR ACCURACY.     Tharon AquasFrank C Patrick, PA 09/28/15 2036

## 2015-09-28 NOTE — ED Notes (Signed)
[facility] with a complaint of dental pain that started today.

## 2016-03-08 ENCOUNTER — Emergency Department (HOSPITAL_COMMUNITY): Payer: BLUE CROSS/BLUE SHIELD

## 2016-03-08 ENCOUNTER — Emergency Department (HOSPITAL_COMMUNITY)
Admission: EM | Admit: 2016-03-08 | Discharge: 2016-03-08 | Disposition: A | Discharge status: Home / Self Care | Payer: BLUE CROSS/BLUE SHIELD | Attending: Emergency Medicine | Admitting: Emergency Medicine

## 2016-03-08 ENCOUNTER — Encounter (HOSPITAL_COMMUNITY): Payer: Self-pay | Admitting: Nurse Practitioner

## 2016-03-08 DIAGNOSIS — O99331 Smoking (tobacco) complicating pregnancy, first trimester: Secondary | ICD-10-CM | POA: Diagnosis not present

## 2016-03-08 DIAGNOSIS — O9989 Other specified diseases and conditions complicating pregnancy, childbirth and the puerperium: Secondary | ICD-10-CM | POA: Insufficient documentation

## 2016-03-08 DIAGNOSIS — N898 Other specified noninflammatory disorders of vagina: Secondary | ICD-10-CM

## 2016-03-08 DIAGNOSIS — Z7951 Long term (current) use of inhaled steroids: Secondary | ICD-10-CM | POA: Insufficient documentation

## 2016-03-08 DIAGNOSIS — R109 Unspecified abdominal pain: Secondary | ICD-10-CM

## 2016-03-08 DIAGNOSIS — Z79899 Other long term (current) drug therapy: Secondary | ICD-10-CM | POA: Diagnosis not present

## 2016-03-08 DIAGNOSIS — Z3A01 Less than 8 weeks gestation of pregnancy: Secondary | ICD-10-CM | POA: Insufficient documentation

## 2016-03-08 DIAGNOSIS — Z792 Long term (current) use of antibiotics: Secondary | ICD-10-CM | POA: Diagnosis not present

## 2016-03-08 DIAGNOSIS — R1032 Left lower quadrant pain: Secondary | ICD-10-CM | POA: Insufficient documentation

## 2016-03-08 DIAGNOSIS — F17210 Nicotine dependence, cigarettes, uncomplicated: Secondary | ICD-10-CM | POA: Insufficient documentation

## 2016-03-08 DIAGNOSIS — R110 Nausea: Secondary | ICD-10-CM | POA: Diagnosis not present

## 2016-03-08 DIAGNOSIS — R102 Other specified pregnancy related conditions, unspecified trimester: Secondary | ICD-10-CM

## 2016-03-08 DIAGNOSIS — M545 Low back pain: Secondary | ICD-10-CM | POA: Insufficient documentation

## 2016-03-08 DIAGNOSIS — O26899 Other specified pregnancy related conditions, unspecified trimester: Secondary | ICD-10-CM

## 2016-03-08 DIAGNOSIS — Z7952 Long term (current) use of systemic steroids: Secondary | ICD-10-CM | POA: Diagnosis not present

## 2016-03-08 DIAGNOSIS — O26891 Other specified pregnancy related conditions, first trimester: Secondary | ICD-10-CM

## 2016-03-08 LAB — I-STAT BETA HCG BLOOD, ED (MC, WL, AP ONLY): I-stat hCG, quantitative: >2000 m[IU]/mL — ABNORMAL HIGH (ref <5)

## 2016-03-08 LAB — URINALYSIS, ROUTINE W REFLEX MICROSCOPIC
BILIRUBIN URINE: NEGATIVE
GLUCOSE, UA: NEGATIVE mg/dL
HGB URINE DIPSTICK: NEGATIVE
Ketones, ur: NEGATIVE mg/dL
Leukocytes, UA: NEGATIVE
Nitrite: NEGATIVE
PROTEIN: NEGATIVE mg/dL
SPECIFIC GRAVITY, URINE: 1.005 (ref 1.005–1.030)
pH: 6.5 (ref 5.0–8.0)

## 2016-03-08 LAB — WET PREP, GENITAL
CLUE CELLS WET PREP: NONE SEEN
SPERM: NONE SEEN
TRICH WET PREP: NONE SEEN
Yeast Wet Prep HPF POC: NONE SEEN

## 2016-03-08 LAB — COMPREHENSIVE METABOLIC PANEL
ALBUMIN: 3.7 g/dL (ref 3.5–5.0)
ALK PHOS: 48 U/L (ref 38–126)
ALT: 12 U/L — AB (ref 14–54)
AST: 17 U/L (ref 15–41)
Anion gap: 8 (ref 5–15)
BUN: <5 mg/dL — ABNORMAL LOW (ref 6–20)
CHLORIDE: 105 mmol/L (ref 101–111)
CO2: 21 mmol/L — AB (ref 22–32)
CREATININE: 0.68 mg/dL (ref 0.44–1.00)
Calcium: 9.4 mg/dL (ref 8.9–10.3)
GFR calc non Af Amer: >60 mL/min (ref >60)
GLUCOSE: 91 mg/dL (ref 65–99)
Potassium: 3.7 mmol/L (ref 3.5–5.1)
SODIUM: 134 mmol/L — AB (ref 135–145)
Total Bilirubin: 0.3 mg/dL (ref 0.3–1.2)
Total Protein: 6.6 g/dL (ref 6.5–8.1)

## 2016-03-08 LAB — CBC
HCT: 42.9 % (ref 36.0–46.0)
Hemoglobin: 14.5 g/dL (ref 12.0–15.0)
MCH: 29.7 pg (ref 26.0–34.0)
MCHC: 33.8 g/dL (ref 30.0–36.0)
MCV: 87.9 fL (ref 78.0–100.0)
PLATELETS: 293 10*3/uL (ref 150–400)
RBC: 4.88 MIL/uL (ref 3.87–5.11)
RDW: 13.3 % (ref 11.5–15.5)
WBC: 8.5 10*3/uL (ref 4.0–10.5)

## 2016-03-08 NOTE — ED Notes (Signed)
Pt c/o 2 dayh history of lower back and abd pain, nausea, vaginal discharge . Denies fevers, n/v, bowel or bladder changes. She had a positive pregnancy test at home last week. She is alert and breathing easily

## 2016-03-08 NOTE — ED Provider Notes (Signed)
CSN: [id number]     Arrival date & time 03/08/16  1740 History  By signing my name below, I, Placido Sou, attest that this documentation has been prepared under the direction and in the presence of Kerrie Buffalo, NP. Electronically Signed: Placido Sou, ED Scribe. 03/08/2016. 7:05 PM.    Chief Complaint  Patient presents with  . Vaginal Discharge   Patient is a 25 y.o. female presenting with vaginal discharge. The history is provided by the patient. No language interpreter was used.  Vaginal Discharge Quality:  Clear Severity:  Mild Onset quality:  Gradual Duration:  2 days Timing:  Constant Progression:  Unchanged Chronicity:  New Context: during pregnancy   Relieved by:  None tried Worsened by:  Nothing tried Ineffective treatments:  None tried Associated symptoms: abdominal pain and nausea   Associated symptoms: no vomiting    HPI Comments: Kristen Li is a 25 y.o. female who presents to the Emergency Department complaining of constant, mild, clear vaginal discharge x 2 days. Pt states that she took an at-home pregnancy test last week which came back positive. She reports associated lower back pain, lower abd pain, nausea and breast tenderness. Pt has a hx of 3 pregnancies with two having come to term and one ending in a miscarriage. Pt denies a hx of STDs and has had the same sexual partner for the past 4 years. Her LNMP was 02/02/2016 and denies any abnormalities. She denies being on birth control. Pt denies any other associated symptoms at this tim  History reviewed. No pertinent past medical history. History reviewed. No pertinent past surgical history. History reviewed. No pertinent family history. Social History  Substance Use Topics  . Smoking status: Current Every Day Smoker -- 0.50 packs/day    Types: Cigarettes  . Smokeless tobacco: None  . Alcohol Use: Yes     Comment: socially   OB History    No data available     Review of Systems  Gastrointestinal:  Positive for nausea and abdominal pain. Negative for vomiting.  Genitourinary: Positive for vaginal discharge.  Musculoskeletal: Positive for myalgias and back pain.  All other systems reviewed and are negative.   Allergies  Review of patient's allergies indicates no known allergies.  Home Medications   Prior to Admission medications   Medication Sig Start Date End Date Taking? Authorizing Provider  fluticasone (FLONASE) 50 MCG/ACT nasal spray Place 1 spray into both nostrils 2 (two) times daily. 07/06/14   Linna Hoff, MD  ipratropium (ATROVENT) 0.06 % nasal spray Place 2 sprays into both nostrils 4 (four) times daily. 05/14/15   Linna Hoff, MD  minocycline (MINOCIN,DYNACIN) 100 MG capsule Take 1 capsule (100 mg total) by mouth 2 (two) times daily. 07/06/14   Linna Hoff, MD  predniSONE (DELTASONE) 50 MG tablet Take 1 tablet daily with breakfast 02/04/15   Nicole Pisciotta, PA-C   BP 139/97 mmHg  Pulse 113  Temp(Src) 98.5 F (36.9 C) (Oral)  Resp 18  SpO2 98%  LMP 02/01/2016    Physical Exam  Constitutional: She is oriented to person, place, and time. She appears well-developed and well-nourished. No distress.  HENT:  Head: Normocephalic and atraumatic.  Eyes: Conjunctivae and EOM are normal.  Neck: Normal range of motion. Neck supple.  Cardiovascular: Normal rate, regular rhythm and normal heart sounds.   Pulmonary/Chest: Effort normal and breath sounds normal.  Abdominal: Soft. Bowel sounds are normal. There is tenderness in the left lower quadrant. There is no rigidity,  no rebound, no guarding and no CVA tenderness.  Mild tenderness LLQ  Genitourinary:  External genitalia without lesions, mucous d/c vaginal vault. Cervix long, closed, mild CMT, left adnexal tenderness, uterus slightly enlarged.   Musculoskeletal: Normal range of motion.  Neurological: She is alert and oriented to person, place, and time.  Skin: Skin is warm and dry.  Psychiatric: She has a normal mood  and affect. Her behavior is normal.  Nursing note and vitals reviewed.   ED Course  Procedures  GC, Chlamydia cultures pending. DIAGNOSTIC STUDIES: Oxygen Saturation is 98% on RA, normal by my interpretation.    COORDINATION OF CARE: 7:00 PM Discussed next steps with pt including a pelvic exam. She verbalized understanding and is agreeable with the plan.   Labs Review Labs Reviewed  WET PREP, GENITAL - Abnormal; Notable for the following:    WBC, Wet Prep HPF POC MODERATE (*)    All other components within normal limits  COMPREHENSIVE METABOLIC PANEL - Abnormal; Notable for the following:    Sodium 134 (*)    CO2 21 (*)    BUN <5 (*)    ALT 12 (*)    All other components within normal limits  I-STAT BETA HCG BLOOD, ED ([facility], [facility], [facility] ONLY) - Abnormal; Notable for the following:    I-stat hCG, quantitative >2000.0 (*)    All other components within normal limits  CBC  URINALYSIS, ROUTINE W REFLEX MICROSCOPIC (NOT AT [facility])  GC/CHLAMYDIA PROBE AMP ([place]) NOT AT [facility]    Imaging Review US Ob Comp Less 14 Wks  03/08/2016  CLINICAL DATA:  Vaginal discharge, beta HCG 2000 EXAM: OBSTETRIC <14 WK [place] AND TRANSVAGINAL OB US TECHNIQUE: Both transabdominal and transvaginal ultrasound examinations were performed for complete evaluation of the gestation as well as the maternal uterus, adnexal regions, and pelvic cul-de-sac. Transvaginal technique was performed to assess early pregnancy. COMPARISON:  None. FINDINGS: Intrauterine gestational sac: Present Yolk sac:  Present Embryo:  Not identified Cardiac Activity: Not identified MSD: 11  mm   5 w   6  d Subchorionic hemorrhage:  Tiny subchorionic hemorrhage noted. Maternal uterus/adnexae: Ovaries are normal. Trace free fluid in the pelvis. IMPRESSION: Findings consistent with early intrauterine gestation Electronically Signed   By: Esperanza Heir M.D.   On: 03/08/2016 20:31   US Ob Transvaginal  03/08/2016  CLINICAL DATA:  Vaginal discharge,  beta HCG 2000 EXAM: OBSTETRIC <14 WK [place] AND TRANSVAGINAL OB US TECHNIQUE: Both transabdominal and transvaginal ultrasound examinations were performed for complete evaluation of the gestation as well as the maternal uterus, adnexal regions, and pelvic cul-de-sac. Transvaginal technique was performed to assess early pregnancy. COMPARISON:  None. FINDINGS: Intrauterine gestational sac: Present Yolk sac:  Present Embryo:  Not identified Cardiac Activity: Not identified MSD: 11  mm   5 w   6  d Subchorionic hemorrhage:  Tiny subchorionic hemorrhage noted. Maternal uterus/adnexae: Ovaries are normal. Trace free fluid in the pelvis. IMPRESSION: Findings consistent with early intrauterine gestation Electronically Signed   By: Esperanza Heir M.D.   On: 03/08/2016 20:31   I have personally reviewed and evaluated these lab results as part of my medical decision-making.   MDM  25 y.o. female with vaginal d/c and lower abdominal pain stable for d/c without torsion or ectopic pregnancy on u/s. She does have a 5 week 6 day IUGS with [facility]. She will start her prenatal care and prenatal vitamins. She will f[facility]u at [facility] if her pain worsens.  Discussed with the patient clinical, lab and u/s findings and plan of care. All questioned fully answered.   Final diagnoses:  Abdominal pain in pregnancy  Vaginal discharge during pregnancy in first trimester   I personally performed the services described in this documentation, which was scribed in my presence. The recorded information has been reviewed and is accurate.    [address]Hope WinchesterM Neese, NP 03/09/16 0206  Leta BaptistEmily Roe Nguyen, MD 03/11/16 [phone]

## 2016-03-08 NOTE — Discharge Instructions (Signed)
Start your prenatal vitamins and prenatal care. If you pain increases or you have OB related problems [facility].

## 2016-03-08 NOTE — ED Notes (Signed)
Pelvic cart set up at bedside patient undressed and ready for exam

## 2016-03-08 NOTE — ED Notes (Signed)
The patient left without discharge instructions and without discharge vitals.  I advised Hope, PA and she advised she had explained discharge instructions to her.  No further action taken.   

## 2016-03-08 NOTE — ED Notes (Addendum)
The patient left without discharge instructions and without discharge vitals.  I advised Hope, PA and she advised she had explained discharge instructions to her.  No further action taken.

## 2016-03-09 LAB — GC/CHLAMYDIA PROBE AMP (~~LOC~~) NOT AT ARMC
CHLAMYDIA, DNA PROBE: NEGATIVE
NEISSERIA GONORRHEA: NEGATIVE

## 2018-05-28 ENCOUNTER — Encounter (HOSPITAL_COMMUNITY): Payer: Self-pay

## 2018-05-28 ENCOUNTER — Ambulatory Visit (HOSPITAL_COMMUNITY)
Admission: EM | Admit: 2018-05-28 | Discharge: 2018-05-28 | Disposition: A | Discharge status: Home / Self Care | Payer: BLUE CROSS/BLUE SHIELD | Attending: Family Medicine | Admitting: Family Medicine

## 2018-05-28 DIAGNOSIS — R1030 Lower abdominal pain, unspecified: Secondary | ICD-10-CM | POA: Diagnosis not present

## 2018-05-28 DIAGNOSIS — N390 Urinary tract infection, site not specified: Secondary | ICD-10-CM

## 2018-05-28 DIAGNOSIS — Z3202 Encounter for pregnancy test, result negative: Secondary | ICD-10-CM | POA: Diagnosis not present

## 2018-05-28 LAB — POCT URINALYSIS DIP (DEVICE)
BILIRUBIN URINE: NEGATIVE
Glucose, UA: NEGATIVE mg/dL
NITRITE: NEGATIVE
PH: 7 (ref 5.0–8.0)
Protein, ur: NEGATIVE mg/dL
Specific Gravity, Urine: 1.02 (ref 1.005–1.030)
Urobilinogen, UA: 0.2 mg/dL (ref 0.0–1.0)

## 2018-05-28 LAB — POCT PREGNANCY, URINE: PREG TEST UR: NEGATIVE

## 2018-05-28 MED ORDER — KETOROLAC TROMETHAMINE 30 MG/ML IJ SOLN
30.0000 mg | Freq: Once | INTRAMUSCULAR | Status: AC
  Administered 2018-05-28: 30 mg via INTRAMUSCULAR

## 2018-05-28 MED ORDER — CEPHALEXIN 500 MG PO CAPS: 500.0000 mg | ORAL_CAPSULE | Freq: Four times a day (QID) | ORAL | 0 refills | Status: DC

## 2018-05-28 MED ORDER — NAPROXEN 500 MG PO TABS: 500.0000 mg | ORAL_TABLET | Freq: Two times a day (BID) | ORAL | 0 refills | Status: DC

## 2018-05-28 MED ORDER — KETOROLAC TROMETHAMINE 30 MG/ML IJ SOLN
INTRAMUSCULAR | Status: AC
  Filled 2018-05-28: qty 1

## 2018-05-28 NOTE — ED Triage Notes (Signed)
Pt c/o cramping throbbing abdominal pain. [place].

## 2018-05-28 NOTE — ED Provider Notes (Signed)
[facility]    CSN: [id number] Arrival date & time: 05/28/18  1200     History   Chief Complaint Chief Complaint  Patient presents with  . Abdominal Pain    HPI Kristen Li is a 27 y.o. female.   Pt is a healthy 27 year old female that presents with suprapubic pain that has gotten increasingly worse over the last week. She recently returned from a vacation and was on a bus for an extended amount of time. She just had her menstrual cycle 1 week ago and it was normal. She denies dysuria, hematuria, vaginal discharge, vaginal bleeding, back pain, flank pain.  She denies  any fever, chills, body aches, night sweats. She denis any nausea, vomiting, diarrhea.  She is sexually active with protected sex. She is not on any birth control.   ROS per HPI      History reviewed. No pertinent past medical history.  There are no active problems to display for this patient.   History reviewed. No pertinent surgical history.  OB History   None      Home Medications    Prior to Admission medications   Medication Sig Start Date End Date Taking? Authorizing Provider  cephALEXin (KEFLEX) 500 MG capsule Take 1 capsule (500 mg total) by mouth 4 (four) times daily. 05/28/18   Lamark Schue, Gloris Manchesterraci A, NP  fluticasone (FLONASE) 50 MCG/ACT nasal spray Place 1 spray into both nostrils 2 (two) times daily. Patient not taking: Reported on 05/28/2018 07/06/14   Linna HoffKindl, James D, MD  ipratropium (ATROVENT) 0.06 % nasal spray Place 2 sprays into both nostrils 4 (four) times daily. Patient not taking: Reported on 05/28/2018 05/14/15   Linna HoffKindl, James D, MD  minocycline (MINOCIN,DYNACIN) 100 MG capsule Take 1 capsule (100 mg total) by mouth 2 (two) times daily. Patient not taking: Reported on 05/28/2018 07/06/14   Linna HoffKindl, James D, MD  naproxen (NAPROSYN) 500 MG tablet Take 1 tablet (500 mg total) by mouth 2 (two) times daily. 05/28/18   Janace ArisBast, Dessie Tatem A, NP  predniSONE (DELTASONE) 50 MG tablet Take 1 tablet daily  with breakfast Patient not taking: Reported on 05/28/2018 02/04/15   Pisciotta, Mardella LaymanNicole, PA-C    Family History History reviewed. No pertinent family history.  Social History Social History   Tobacco Use  . Smoking status: Current Every Day Smoker    Packs/day: 0.50    Types: Cigarettes  . Smokeless tobacco: Current User  Substance Use Topics  . Alcohol use: Yes    Comment: socially  . Drug use: Yes    Types: Marijuana     Allergies   Patient has no known allergies.   Review of Systems Review of Systems   Physical Exam Triage Vital Signs ED Triage Vitals  Enc Vitals Group     BP 05/28/18 1211 131/90     Pulse Rate 05/28/18 1211 100     Resp 05/28/18 1211 18     Temp 05/28/18 1211 98.5 F (36.9 C)     Temp Source 05/28/18 1211 Oral     SpO2 05/28/18 1211 98 %     Weight 05/28/18 1214 170 lb (77.1 kg)     Height --      Head Circumference --      Peak Flow --      Pain Score 05/28/18 1214 8     Pain Loc --      Pain Edu? --      Excl. in GC? --  No data found.  Updated Vital Signs BP 131/90   Pulse 100   Temp 98.5 F (36.9 C) (Oral)   Resp 18   Wt 170 lb (77.1 kg)   LMP 05/25/2018   SpO2 98%   Visual Acuity Right Eye Distance:   Left Eye Distance:   Bilateral Distance:    Right Eye Near:   Left Eye Near:    Bilateral Near:     Physical Exam  Constitutional: She is oriented to person, place, and time. She appears well-developed and well-nourished.  Non-toxic appearance. She does not appear ill.  HENT:  Head: Normocephalic and atraumatic.  Pulmonary/Chest: Effort normal.  Abdominal: Soft. Normal appearance and bowel sounds are normal. There is tenderness in the suprapubic area. There is no rebound and no CVA tenderness.  Neurological: She is alert and oriented to person, place, and time.  Skin: Skin is warm and dry.  Psychiatric: She has a normal mood and affect.  Nursing note and vitals reviewed.    UC Treatments / Results  Labs (all  labs ordered are listed, but only abnormal results are displayed) Labs Reviewed  POCT URINALYSIS DIP (DEVICE) - Abnormal; Notable for the following components:      Result Value   Ketones, ur TRACE (*)    Hgb urine dipstick MODERATE (*)    Leukocytes, UA MODERATE (*)    All other components within normal limits  POCT PREGNANCY, URINE    EKG None  Radiology No results found.  Procedures Procedures (including critical care time)  Medications Ordered in UC Medications  ketorolac (TORADOL) 30 MG/ML injection 30 mg (30 mg Intramuscular Given 05/28/18 1255)    Initial Impression / Assessment and Plan / UC Course  I have reviewed the triage vital signs and the nursing notes.  Pertinent labs & imaging results that were available during my care of the patient were reviewed by me and considered in my medical decision making (see chart for details).     Urine showed moderate hemoglobin and leukocytes.  Will Go ahead and treat for urinary tract infection.  We will send urine for culture.  Toradol injection in clinic for pain.  Naproxen for pain at home and Keflex for the infection.  Strict return precautions given. Final Clinical Impressions(s) / UC Diagnoses   Final diagnoses:  Lower urinary tract infectious disease     Discharge Instructions     It was nice meeting you!!  We will treat you with antibiotics to see if this helps.  Toradol injection in clinic for pain. You can take naproxen twice a day for pain, with food.  Follow up if worse or not better in the next few days.  For more severe symptoms go to the ER.     ED Prescriptions    Medication Sig Dispense Auth. Provider   cephALEXin (KEFLEX) 500 MG capsule Take 1 capsule (500 mg total) by mouth 4 (four) times daily. 28 capsule Khylin Gutridge A, NP   naproxen (NAPROSYN) 500 MG tablet Take 1 tablet (500 mg total) by mouth 2 (two) times daily. 30 tablet Dahlia ByesBast, Amerigo Mcglory A, NP     Controlled Substance Prescriptions [place]  Controlled Substance Registry consulted? Not Applicable   Janace ArisBast, Cain Fitzhenry A, NP 05/28/18 1541

## 2018-05-28 NOTE — Discharge Instructions (Addendum)
It was nice meeting you!!  We will treat you with antibiotics to see if this helps.  Toradol injection in clinic for pain. You can take naproxen twice a day for pain, with food.  Follow up if worse or not better in the next few days.  For more severe symptoms go to the ER.

## 2018-10-20 ENCOUNTER — Ambulatory Visit (HOSPITAL_COMMUNITY)
Admission: EM | Admit: 2018-10-20 | Discharge: 2018-10-20 | Disposition: A | Discharge status: Home / Self Care | Payer: BLUE CROSS/BLUE SHIELD | Attending: Internal Medicine | Admitting: Internal Medicine

## 2018-10-20 ENCOUNTER — Encounter (HOSPITAL_COMMUNITY): Payer: Self-pay | Admitting: Emergency Medicine

## 2018-10-20 DIAGNOSIS — J111 Influenza due to unidentified influenza virus with other respiratory manifestations: Secondary | ICD-10-CM

## 2018-10-20 DIAGNOSIS — R69 Illness, unspecified: Secondary | ICD-10-CM | POA: Diagnosis present

## 2018-10-20 MED ORDER — OSELTAMIVIR PHOSPHATE 75 MG PO CAPS: 75.0000 mg | ORAL_CAPSULE | Freq: Two times a day (BID) | ORAL | 0 refills | Status: DC

## 2018-10-20 NOTE — ED Triage Notes (Signed)
Pt here with flu like sx and body aches x 2 days

## 2018-10-20 NOTE — ED Provider Notes (Addendum)
[facility]    CSN: [id number] Arrival date & time: 10/20/18  1130   History   Chief Complaint Chief Complaint  Patient presents with  . Influenza    HPI Kristen Li is a 28 y.o. female.   Pt developed cough yesterday am, and later on in the day developed nose congestion, HA and body aches. Had a fever of 101. She has not been sick at all in the past 3 weeks. She is a smoker. Vomited x 3 in the middle of the night, but is not nauseous right now.     History reviewed. No pertinent past medical history.  There are no active problems to display for this patient.  History reviewed. No pertinent surgical history.  OB History   No obstetric history on file.    Home Medications    Prior to Admission medications   Medication Sig Start Date End Date Taking? Authorizing Provider  naproxen (NAPROSYN) 500 MG tablet Take 1 tablet (500 mg total) by mouth 2 (two) times daily. 05/28/18   Dahlia ByesBast, Traci A, NP  oseltamivir (TAMIFLU) 75 MG capsule Take 1 capsule (75 mg total) by mouth every 12 (twelve) hours. 10/20/18   Rodriguez-Southworth, Nettie ElmSylvia, PA-C    Family History History reviewed. No pertinent family history.  Social History Social History   Tobacco Use  . Smoking status: Current Every Day Smoker    Packs/day: 0.50    Types: Cigarettes  . Smokeless tobacco: Current User  Substance Use Topics  . Alcohol use: Yes    Comment: socially  . Drug use: Yes    Types: Marijuana    Allergies   Patient has no known allergies.  Review of Systems Review of Systems  Constitutional: Positive for appetite change, chills, diaphoresis, fatigue and fever.  HENT: Positive for congestion, ear pain, postnasal drip, rhinorrhea and sore throat. Negative for ear discharge and trouble swallowing.   Eyes: Positive for discharge. Negative for redness and visual disturbance.  Respiratory: Positive for cough and chest tightness. Negative for shortness of breath.        Has mild chest  tightness  When she breaths's  Cardiovascular: Negative for chest pain.  Gastrointestinal: Positive for nausea and vomiting. Negative for abdominal pain and diarrhea.  Genitourinary: Negative for dysuria.  Musculoskeletal: Positive for myalgias. Negative for gait problem.  Skin: Negative for rash.  Neurological: Positive for dizziness and headaches.   Physical Exam Triage Vital Signs ED Triage Vitals [10/20/18 1219]  Enc Vitals Group     BP 137/84     Pulse Rate 86     Resp 18     Temp 99.5 F (37.5 C)     Temp Source Temporal     SpO2 100 %     Weight      Height      Head Circumference      Peak Flow      Pain Score 7     Pain Loc      Pain Edu?      Excl. in [place]?    No data found.  Updated Vital Signs BP 137/84 (BP Location: Right Arm)   Pulse 86   Temp 99.5 F (37.5 C) (Temporal)   Resp 18   SpO2 100%   Visual Acuity Right Eye Distance:   Left Eye Distance:   Bilateral Distance:    Right Eye Near:   Left Eye Near:    Bilateral Near:     Physical Exam Vitals signs  and nursing note reviewed.  Constitutional:      Appearance: She is ill-appearing.  HENT:     Right Ear: Tympanic membrane, ear canal and external ear normal.     Left Ear: Tympanic membrane, ear canal and external ear normal.     Nose: Congestion and rhinorrhea present.     Mouth/Throat:     Pharynx: Posterior oropharyngeal erythema present. No oropharyngeal exudate.  Eyes:     General: No scleral icterus.    Conjunctiva/sclera: Conjunctivae normal.     Comments: Both eyes are watering  Neck:     Musculoskeletal: Neck supple. No neck rigidity or muscular tenderness.     Comments: Has small anterior cervical chain nodes present bilaterally Cardiovascular:     Rate and Rhythm: Normal rate and regular rhythm.     Heart sounds: No murmur.  Pulmonary:     Effort: Pulmonary effort is normal. No respiratory distress.     Breath sounds: No wheezing, rhonchi or rales.  Abdominal:     General:  Bowel sounds are normal. There is no distension.     Palpations: There is no mass.     Tenderness: There is no abdominal tenderness. There is no guarding.  Musculoskeletal: Normal range of motion.  Lymphadenopathy:     Cervical: Cervical adenopathy present.  Skin:    General: Skin is warm and dry.     Coloration: Skin is not jaundiced.     Findings: No erythema or rash.  Neurological:     Mental Status: She is alert and oriented to person, place, and time.  Psychiatric:        Mood and Affect: Mood normal.        Behavior: Behavior normal.        Thought Content: Thought content normal.        Judgment: Judgment normal.    UC Treatments / Results  Labs (all labs ordered are listed, but only abnormal results are displayed) Labs Reviewed  CULTURE, GROUP A STREP [facility])  Neg rapid strep  EKG None  Radiology No results found.  Procedures Procedures   Medications Ordered in UC Medications - No data to display  Initial Impression / Assessment and Plan / UC Course  I have reviewed the triage vital signs and the nursing notes.  Pertinent labs results that were available during my care of the patient were reviewed by me and considered in my medical decision making (see chart for details). I suspect she has influenza and I placed her on Tamiflu. See instructions.  She declined medication for nausea while here or to be sent to her pharmacy, since the nasea has resolved.  Clinical Course as of Oct 20 1745  Mon Oct 20, 2018  1324 negative  POCT Rapid Strep A [SR]    Clinical Course User Index [SR] Rodriguez-Southworth, Nettie Elm, [place]   Final Clinical Impressions(s) / UC Diagnoses   Final diagnoses:  Influenza-like illness     Discharge Instructions     You may take Nyquil at night and Dayquil during the day as needed for your symptoms  I am placing you on Tamiflu to cover for the Flu which is what I suspect you have, you should start feeling better in 48h, but if you  get worse, you could develop bronchitis or pneumonia since you are a smoker. Please do not smoke while sick, and if you can work on quitting.    You may not return to work until you have not had  a fever for 24 h.     ED Prescriptions    Medication Sig Dispense Auth. Provider   oseltamivir (TAMIFLU) 75 MG capsule Take 1 capsule (75 mg total) by mouth every 12 (twelve) hours. 10 capsule Rodriguez-Southworth, Nettie Elm, PA-C     Controlled Substance Prescriptions [place] Controlled Substance Registry consulted? no   Garey Ham, PA-C 10/20/18 1324    Rodriguez-Southworth, [place], [place] 10/20/18 1749

## 2018-10-20 NOTE — Discharge Instructions (Addendum)
You may take Nyquil at night and Dayquil during the day as needed for your symptoms  I am placing you on Tamiflu to cover for the Flu which is what I suspect you have, you should start feeling better in 48h, but if you get worse, you could develop bronchitis or pneumonia since you are a smoker. Please do not smoke while sick, and if you can work on quitting.    You may not return to work until you have not had a fever for 24 h.

## 2018-10-22 LAB — CULTURE, GROUP A STREP (THRC)

## 2019-11-27 ENCOUNTER — Emergency Department (HOSPITAL_COMMUNITY)
Admission: EM | Admit: 2019-11-27 | Discharge: 2019-11-27 | Disposition: A | Discharge status: Home / Self Care | Payer: BC Managed Care – PPO | Attending: Emergency Medicine | Admitting: Emergency Medicine

## 2019-11-27 ENCOUNTER — Encounter (HOSPITAL_COMMUNITY): Payer: Self-pay | Admitting: Emergency Medicine

## 2019-11-27 ENCOUNTER — Other Ambulatory Visit: Payer: Self-pay

## 2019-11-27 DIAGNOSIS — F17210 Nicotine dependence, cigarettes, uncomplicated: Secondary | ICD-10-CM | POA: Insufficient documentation

## 2019-11-27 DIAGNOSIS — R197 Diarrhea, unspecified: Secondary | ICD-10-CM | POA: Diagnosis not present

## 2019-11-27 DIAGNOSIS — R112 Nausea with vomiting, unspecified: Secondary | ICD-10-CM

## 2019-11-27 DIAGNOSIS — E860 Dehydration: Secondary | ICD-10-CM | POA: Diagnosis not present

## 2019-11-27 DIAGNOSIS — R1084 Generalized abdominal pain: Secondary | ICD-10-CM | POA: Diagnosis present

## 2019-11-27 LAB — COMPREHENSIVE METABOLIC PANEL
ALT: 12 U/L (ref 0–44)
AST: 16 U/L (ref 15–41)
Albumin: 3.7 g/dL (ref 3.5–5.0)
Alkaline Phosphatase: 51 U/L (ref 38–126)
Anion gap: 12 (ref 5–15)
BUN: 13 mg/dL (ref 6–20)
CO2: 23 mmol/L (ref 22–32)
Calcium: 8.9 mg/dL (ref 8.9–10.3)
Chloride: 103 mmol/L (ref 98–111)
Creatinine, Ser: 1.7 mg/dL — ABNORMAL HIGH (ref 0.44–1.00)
GFR calc Af Amer: 46 mL/min — ABNORMAL LOW (ref >60)
GFR calc non Af Amer: 40 mL/min — ABNORMAL LOW (ref >60)
Glucose, Bld: 104 mg/dL — ABNORMAL HIGH (ref 70–99)
Potassium: 3.6 mmol/L (ref 3.5–5.1)
Sodium: 138 mmol/L (ref 135–145)
Total Bilirubin: 0.4 mg/dL (ref 0.3–1.2)
Total Protein: 6.9 g/dL (ref 6.5–8.1)

## 2019-11-27 LAB — CBC
HCT: 43.8 % (ref 36.0–46.0)
Hemoglobin: 15 g/dL (ref 12.0–15.0)
MCH: 30.7 pg (ref 26.0–34.0)
MCHC: 34.2 g/dL (ref 30.0–36.0)
MCV: 89.8 fL (ref 80.0–100.0)
Platelets: 271 10*3/uL (ref 150–400)
RBC: 4.88 MIL/uL (ref 3.87–5.11)
RDW: 13.1 % (ref 11.5–15.5)
WBC: 8.9 10*3/uL (ref 4.0–10.5)
nRBC: 0 % (ref 0.0–0.2)

## 2019-11-27 LAB — URINALYSIS, ROUTINE W REFLEX MICROSCOPIC
Bilirubin Urine: NEGATIVE
Glucose, UA: NEGATIVE mg/dL
Ketones, ur: 5 mg/dL — AB
Leukocytes,Ua: NEGATIVE
Nitrite: NEGATIVE
Protein, ur: NEGATIVE mg/dL
Specific Gravity, Urine: 1.006 (ref 1.005–1.030)
pH: 5 (ref 5.0–8.0)

## 2019-11-27 LAB — I-STAT BETA HCG BLOOD, ED (MC, WL, AP ONLY): I-stat hCG, quantitative: <5 m[IU]/mL (ref <5)

## 2019-11-27 LAB — LIPASE, BLOOD: Lipase: 27 U/L (ref 11–51)

## 2019-11-27 MED ORDER — SODIUM CHLORIDE 0.9 % IV BOLUS (SEPSIS)
1000.0000 mL | Freq: Once | INTRAVENOUS | Status: AC
  Administered 2019-11-27: 05:00:00 1000 mL via INTRAVENOUS

## 2019-11-27 MED ORDER — ONDANSETRON HCL 4 MG/2ML IJ SOLN
4.0000 mg | Freq: Once | INTRAMUSCULAR | Status: AC
  Administered 2019-11-27: 05:00:00 4 mg via INTRAVENOUS
  Filled 2019-11-27: qty 2

## 2019-11-27 MED ORDER — SODIUM CHLORIDE 0.9 % IV BOLUS (SEPSIS)
1000.0000 mL | Freq: Once | INTRAVENOUS | Status: AC
  Administered 2019-11-27: 06:00:00 1000 mL via INTRAVENOUS

## 2019-11-27 MED ORDER — SODIUM CHLORIDE 0.9% FLUSH: 3.0000 mL | Freq: Once | INTRAVENOUS | Status: DC

## 2019-11-27 MED ORDER — KETOROLAC TROMETHAMINE 30 MG/ML IJ SOLN
30.0000 mg | Freq: Once | INTRAMUSCULAR | Status: AC
  Administered 2019-11-27: 05:00:00 30 mg via INTRAVENOUS
  Filled 2019-11-27: qty 1

## 2019-11-27 MED ORDER — ONDANSETRON HCL 4 MG/2ML IJ SOLN
4.0000 mg | Freq: Once | INTRAMUSCULAR | Status: AC
  Administered 2019-11-27: 08:00:00 4 mg via INTRAVENOUS
  Filled 2019-11-27: qty 2

## 2019-11-27 MED ORDER — ONDANSETRON 8 MG PO TBDP: 8.0000 mg | ORAL_TABLET | Freq: Three times a day (TID) | ORAL | 0 refills | Status: DC | PRN

## 2019-11-27 NOTE — Discharge Instructions (Signed)
return for worsening symptoms, see your doctor in 1 week if better

## 2019-11-27 NOTE — ED Provider Notes (Signed)
[facility] EMERGENCY DEPARTMENT Provider Note   CSN: [id number] Arrival date & time: 11/27/19  0049     History Chief Complaint  Patient presents with  . Abdominal Pain    Kristen Li is a 29 y.o. female.  The history is provided by the patient.  Abdominal Pain Pain location:  Generalized Pain quality: aching   Pain severity:  Mild Onset quality:  Gradual Timing:  Intermittent Progression:  Worsening Chronicity:  New Relieved by:  None tried Worsened by:  Nothing Associated symptoms: cough, diarrhea, fatigue and vomiting   Associated symptoms: no fever, no hematemesis and no hematochezia   Patient is an otherwise healthy female with vomiting/diarrhea.  This been ongoing for the past several days with diarrhea worsening today.  She reports generalized abdominal pain worse with vomiting.  No blood in the vomit or stool.      PMH-none OB History   No obstetric history on file.     No family history on file.  Social History   Tobacco Use  . Smoking status: Current Every Day Smoker    Packs/day: 0.50    Types: Cigarettes  . Smokeless tobacco: Current User  Substance Use Topics  . Alcohol use: Yes    Comment: socially  . Drug use: Yes    Types: Marijuana    Home Medications Prior to Admission medications   Medication Sig Start Date End Date Taking? Authorizing Provider  oseltamivir (TAMIFLU) 75 MG capsule Take 1 capsule (75 mg total) by mouth every 12 (twelve) hours. Patient not taking: Reported on 11/27/2019 10/20/18   Rodriguez-Southworth, Nettie Elm, PA-C    Allergies    Patient has no known allergies.  Review of Systems   Review of Systems  Constitutional: Positive for fatigue. Negative for fever.  Respiratory: Positive for cough.   Gastrointestinal: Positive for abdominal pain, diarrhea and vomiting. Negative for hematemesis and hematochezia.  Neurological: Positive for weakness.  All other systems reviewed and are  negative.   Physical Exam Updated Vital Signs BP (!) 130/91 (BP Location: Right Arm)   Pulse 64   Temp 99.2 F (37.3 C) (Oral)   Resp 16   LMP 11/24/2019   SpO2 96%   Physical Exam CONSTITUTIONAL: Well developed/well nourished, uncomfortable appearing HEAD: Normocephalic/atraumatic EYES: EOMI/PERRL no icterus ENMT: Mucous membranes moist NECK: supple no meningeal signs SPINE/BACK:entire spine nontender CV: S1/S2 noted, no murmurs/rubs/gallops noted LUNGS: Lungs are clear to auscultation bilaterally, no apparent distress ABDOMEN: soft, mild diffuse tenderness, no rebound or guarding, bowel sounds noted throughout abdomen GU:no cva tenderness NEURO: Pt is awake/alert/appropriate, moves all extremitiesx4.  No facial droop.   EXTREMITIES: pulses normal/equal, full ROM SKIN: warm, color normal PSYCH: no abnormalities of mood noted, alert and oriented to situation  ED Results / Procedures / Treatments   Labs (all labs ordered are listed, but only abnormal results are displayed) Labs Reviewed  COMPREHENSIVE METABOLIC PANEL - Abnormal; Notable for the following components:      Result Value   Glucose, Bld 104 (*)    Creatinine, Ser 1.70 (*)    GFR calc non Af Amer 40 (*)    GFR calc Af Amer 46 (*)    All other components within normal limits  URINALYSIS, ROUTINE W REFLEX MICROSCOPIC - Abnormal; Notable for the following components:   Hgb urine dipstick MODERATE (*)    Ketones, ur 5 (*)    Bacteria, UA RARE (*)    All other components within normal limits  LIPASE, BLOOD  CBC  I-STAT BETA HCG BLOOD, ED ([facility], [facility], [facility] ONLY)    EKG None  Radiology No results found.  Procedures Procedures   Medications Ordered in ED Medications  sodium chloride flush (NS) 0.9 % injection 3 mL (has no administration in time range)  ondansetron (ZOFRAN) injection 4 mg (has no administration in time range)  sodium chloride 0.9 % bolus 1,000 mL (0 mLs Intravenous Stopped 11/27/19 0618)   ondansetron (ZOFRAN) injection 4 mg (4 mg Intravenous Given 11/27/19 0517)  ketorolac (TORADOL) 30 MG/ML injection 30 mg (30 mg Intravenous Given 11/27/19 0517)  sodium chloride 0.9 % bolus 1,000 mL (1,000 mLs Intravenous New Bag/Given 11/27/19 0618)    ED Course  I have reviewed the triage vital signs and the nursing notes.  Pertinent labs & imaging results that were available during my care of the patient were reviewed by me and considered in my medical decision making (see chart for details).    MDM Rules/Calculators/A&P                     4:34 AM Patient presents for vomiting and diarrhea.  Will give IV fluids.  She is dehydrated. No imaging indicated at this time 7:10 AM Pt improving but still with nausea She thinks this is all related to stress Will give another round of zofran If improved she can be discharged No signs of acute abdomen Signed out to dr Sabra Heck at shift change  Final Clinical Impression(s) / ED Diagnoses Final diagnoses:  Nausea vomiting and diarrhea  Dehydration    Rx / DC Orders ED Discharge Orders         Ordered    ondansetron (ZOFRAN ODT) 8 MG disintegrating tablet  Every 8 hours PRN     11/27/19 0710           Ripley Fraise, MD 11/27/19 [phone]

## 2019-11-27 NOTE — ED Triage Notes (Signed)
Patient reports pain across her abdomen with emesis and diarrhea onset yesterday , denies fever or chills .

## 2020-01-23 ENCOUNTER — Ambulatory Visit (INDEPENDENT_AMBULATORY_CARE_PROVIDER_SITE_OTHER): Payer: BC Managed Care – PPO

## 2020-01-23 ENCOUNTER — Ambulatory Visit
Admission: EM | Admit: 2020-01-23 | Discharge: 2020-01-23 | Disposition: A | Discharge status: Home / Self Care | Payer: BC Managed Care – PPO | Attending: Emergency Medicine | Admitting: Emergency Medicine

## 2020-01-23 ENCOUNTER — Other Ambulatory Visit: Payer: Self-pay

## 2020-01-23 ENCOUNTER — Encounter: Payer: Self-pay | Admitting: Emergency Medicine

## 2020-01-23 DIAGNOSIS — M79671 Pain in right foot: Secondary | ICD-10-CM

## 2020-01-23 DIAGNOSIS — S99921A Unspecified injury of right foot, initial encounter: Secondary | ICD-10-CM | POA: Diagnosis not present

## 2020-01-23 MED ORDER — TETANUS-DIPHTH-ACELL PERTUSSIS 5-2.5-18.5 LF-MCG/0.5 IM SUSP
0.5000 mL | Freq: Once | INTRAMUSCULAR | Status: AC
  Administered 2020-01-23: 14:00:00 0.5 mL via INTRAMUSCULAR

## 2020-01-23 MED ORDER — AMOXICILLIN-POT CLAVULANATE 875-125 MG PO TABS: 1.0000 | ORAL_TABLET | Freq: Two times a day (BID) | ORAL | 0 refills | Status: AC

## 2020-01-23 NOTE — Discharge Instructions (Addendum)
Keep area clean and dry. Take antibiotic twice daily with food. Return for worsening pain, swelling, redness, difficulty bearing weight, fever.

## 2020-01-23 NOTE — ED Provider Notes (Signed)
[facility]    CSN: [id number] Arrival date & time: 01/23/20  1236      History   Chief Complaint Chief Complaint  Patient presents with  . Foot Pain    HPI Kristen Li is a 29 y.o. female presenting for right foot injury.  States she was wearing crocs, walking around the yard, when she thinks she stepped on a piece of glass.  Patient endorsing pain, swelling.  Unsure if last is still in her foot.  Denies numbness.  Tetanus status unknown.   History reviewed. No pertinent past medical history.  There are no problems to display for this patient.   History reviewed. No pertinent surgical history.  OB History   No obstetric history on file.      Home Medications    Prior to Admission medications   Medication Sig Start Date End Date Taking? Authorizing Provider  amoxicillin-clavulanate (AUGMENTIN) 875-125 MG tablet Take 1 tablet by mouth every 12 (twelve) hours for 5 days. 01/23/20 01/28/20  Hall-Potvin, [place], PA-C    Family History History reviewed. No pertinent family history.  Social History Social History   Tobacco Use  . Smoking status: Current Every Day Smoker    Packs/day: 0.50    Types: Cigarettes  . Smokeless tobacco: Current User  Substance Use Topics  . Alcohol use: Yes    Comment: socially  . Drug use: Yes    Types: Marijuana     Allergies   Patient has no known allergies.   Review of Systems As per HPI   Physical Exam Triage Vital Signs ED Triage Vitals  Enc Vitals Group     BP 01/23/20 1248 (!) 139/100     Pulse Rate 01/23/20 1248 89     Resp 01/23/20 1248 18     Temp 01/23/20 1248 98.1 F (36.7 C)     Temp Source 01/23/20 1248 Oral     SpO2 01/23/20 1248 98 %     Weight --      Height --      Head Circumference --      Peak Flow --      Pain Score 01/23/20 1250 8     Pain Loc --      Pain Edu? --      Excl. in [place]? --    No data found.  Updated Vital Signs BP (!) 139/100 (BP Location: Left Arm)   Pulse  89   Temp 98.1 F (36.7 C) (Oral)   Resp 18   LMP 01/20/2020   SpO2 98%   Visual Acuity Right Eye Distance:   Left Eye Distance:   Bilateral Distance:    Right Eye Near:   Left Eye Near:    Bilateral Near:     Physical Exam Constitutional:      General: She is not in acute distress. HENT:     Head: Normocephalic and atraumatic.  Eyes:     General: No scleral icterus.    Pupils: Pupils are equal, round, and reactive to light.  Cardiovascular:     Rate and Rhythm: Normal rate.  Pulmonary:     Effort: Pulmonary effort is normal.  Musculoskeletal:     Comments: Ball of right foot with punctate wound.  No active bleeding, serous discharge.  Exquisite TTP with swelling.  No appreciable foreign body.  Skin:    Coloration: Skin is not jaundiced or pale.  Neurological:     Mental Status: She is alert and oriented to  person, place, and time.      UC Treatments / Results  Labs (all labs ordered are listed, but only abnormal results are displayed) Labs Reviewed - No data to display  EKG   Radiology DG Foot Complete Right  Result Date: 01/23/2020 CLINICAL DATA:  Puncture wound with glass. Evaluate for foreign body plantar surface between second and third MTP joints. EXAM: RIGHT FOOT COMPLETE - 3+ VIEW COMPARISON:  None. FINDINGS: There is no evidence of fracture or dislocation. There is no evidence of arthropathy or other focal bone abnormality. Soft tissues are unremarkable without evidence of radiopaque foreign body. Small inferior calcaneal spur. IMPRESSION: No acute findings. No definite radiopaque foreign body between the second third toes. Electronically Signed   By: Elberta Fortis M.D.   On: 01/23/2020 13:42    Procedures Procedures (including critical care time)  Medications Ordered in UC Medications  Tdap (BOOSTRIX) injection 0.5 mL (0.5 mLs Intramuscular Given 01/23/20 1342)    Initial Impression / Assessment and Plan / UC Course  I have reviewed the triage  vital signs and the nursing notes.  Pertinent labs & imaging results that were available during my care of the patient were reviewed by me and considered in my medical decision making (see chart for details).     Right foot x-ray obtained in office, reviewed by me radiology: Negative for radiopaque foreign body, soft tissue edema.  Reviewed findings with patient who verbalized understanding..  Tetanus updated in office today.  Will do short course of Augmentin given puncture wound to bottom of foot.  Return precautions discussed, patient verbalized understanding and is agreeable to plan. Final Clinical Impressions(s) / UC Diagnoses   Final diagnoses:  Foot injury, right, initial encounter     Discharge Instructions     Keep area clean and dry. Take antibiotic twice daily with food. Return for worsening pain, swelling, redness, difficulty bearing weight, fever.    ED Prescriptions    Medication Sig Dispense Auth. Provider   amoxicillin-clavulanate (AUGMENTIN) 875-125 MG tablet Take 1 tablet by mouth every 12 (twelve) hours for 5 days. 10 tablet Hall-Potvin, [place], PA-C     PDMP not reviewed this encounter.   Hall-Potvin, [place], [place] 01/24/20 [phone]

## 2020-01-23 NOTE — ED Triage Notes (Signed)
Right foot injury.  Patient was wearing crocks in the yard.  Patient thinks she stepped on glass.  Ball of foot is painful.  Top of foot is red and swollen

## 2020-01-24 ENCOUNTER — Encounter: Payer: Self-pay | Admitting: Emergency Medicine

## 2021-11-06 ENCOUNTER — Ambulatory Visit (HOSPITAL_COMMUNITY)
Admission: EM | Admit: 2021-11-06 | Discharge: 2021-11-06 | Disposition: A | Discharge status: Home / Self Care | Payer: 59 | Attending: Family Medicine | Admitting: Family Medicine

## 2021-11-06 ENCOUNTER — Other Ambulatory Visit: Payer: Self-pay

## 2021-11-06 DIAGNOSIS — L03211 Cellulitis of face: Secondary | ICD-10-CM | POA: Diagnosis not present

## 2021-11-06 MED ORDER — AMOXICILLIN-POT CLAVULANATE 875-125 MG PO TABS: 1.0000 | ORAL_TABLET | Freq: Two times a day (BID) | ORAL | 0 refills | Status: AC

## 2021-11-06 NOTE — ED Provider Notes (Signed)
[facility]    CSN: [id number] Arrival date & time: 11/06/21  1030      History   Chief Complaint No chief complaint on file.   HPI Kristen Li is a 31 y.o. female.   HPI Here with a 4-day history of swelling and induration on her left cheek.  It actually began near her left inner canthus of her eye.  Now that is not swollen but in the last 2 to 3 days she has had swelling more of her central left cheek.  She did have a punctate area opened up with no discharge really.  No fever or chills. No tooth pain, but she does have some dental caries.  No past medical history on file.  There are no problems to display for this patient.   No past surgical history on file.  OB History   No obstetric history on file.      Home Medications    Prior to Admission medications   Medication Sig Start Date End Date Taking? Authorizing Provider  amoxicillin-clavulanate (AUGMENTIN) 875-125 MG tablet Take 1 tablet by mouth 2 (two) times daily for 7 days. 11/06/21 11/13/21 Yes Barrett Henle, MD    Family History No family history on file.  Social History Social History   Tobacco Use   Smoking status: Every Day    Packs/day: 0.50    Types: Cigarettes   Smokeless tobacco: Current  Substance Use Topics   Alcohol use: Yes    Comment: socially   Drug use: Yes    Types: Marijuana     Allergies   Patient has no known allergies.   Review of Systems Review of Systems   Physical Exam Triage Vital Signs ED Triage Vitals [11/06/21 1104]  Enc Vitals Group     BP (!) 140/102     Pulse Rate 75     Resp 18     Temp 98.4 F (36.9 C)     Temp Source Oral     SpO2 99 %     Weight      Height      Head Circumference      Peak Flow      Pain Score      Pain Loc      Pain Edu?      Excl. in [place]?    No data found.  Updated Vital Signs BP (!) 140/102 (BP Location: Left Arm)    Pulse 75    Temp 98.4 F (36.9 C) (Oral)    Resp 18    SpO2 99%   Visual  Acuity Right Eye Distance:   Left Eye Distance:   Bilateral Distance:    Right Eye Near:   Left Eye Near:    Bilateral Near:     Physical Exam Vitals reviewed.  Constitutional:      General: She is not in acute distress.    Appearance: She is not toxic-appearing.  HENT:     Head:     Comments: Induration of left cheek noted, about 2 cm diameter. No dc. Mild erythema, hazy at most     Mouth/Throat:     Mouth: Mucous membranes are moist.     Pharynx: No oropharyngeal exudate or posterior oropharyngeal erythema.     Comments: Carious tooth noted left upper molar Eyes:     Extraocular Movements: Extraocular movements intact.     Pupils: Pupils are equal, round, and reactive to light.  Cardiovascular:  Rate and Rhythm: Normal rate and regular rhythm.     Heart sounds: No murmur heard. Pulmonary:     Effort: Pulmonary effort is normal.     Breath sounds: Normal breath sounds.  Musculoskeletal:     Cervical back: Neck supple.  Lymphadenopathy:     Cervical: No cervical adenopathy.  Skin:    Capillary Refill: Capillary refill takes less than 2 seconds.     Coloration: Skin is not jaundiced or pale.  Neurological:     General: No focal deficit present.     Mental Status: She is alert and oriented to person, place, and time.  Psychiatric:        Behavior: Behavior normal.     UC Treatments / Results  Labs (all labs ordered are listed, but only abnormal results are displayed) Labs Reviewed - No data to display  EKG   Radiology No results found.  Procedures Procedures (including critical care time)  Medications Ordered in UC Medications - No data to display  Initial Impression / Assessment and Plan / UC Course  I have reviewed the triage vital signs and the nursing notes.  Pertinent labs & imaging results that were available during my care of the patient were reviewed by me and considered in my medical decision making (see chart for details).    Will treat  with augmentin to cover oral microbes also. Given info on low cost dental treatment Final Clinical Impressions(s) / UC Diagnoses   Final diagnoses:  Cellulitis of face     Discharge Instructions      Take amoxicillin/clavulanic acid 875 mg, 1 tab twice daily for 7 days.  Use warm compresses to the affected area 3 times daily     ED Prescriptions     Medication Sig Dispense Auth. Provider   amoxicillin-clavulanate (AUGMENTIN) 875-125 MG tablet Take 1 tablet by mouth 2 (two) times daily for 7 days. 14 tablet Ariyannah Pauling, Gwenlyn Perking, MD      PDMP not reviewed this encounter.   Barrett Henle, MD 11/06/21 1116

## 2021-11-06 NOTE — ED Triage Notes (Signed)
Pt presents with possible cyst on her face.

## 2021-11-06 NOTE — Discharge Instructions (Addendum)
Take amoxicillin/clavulanic acid 875 mg, 1 tab twice daily for 7 days.  Use warm compresses to the affected area 3 times daily

## 2023-09-29 ENCOUNTER — Emergency Department (HOSPITAL_BASED_OUTPATIENT_CLINIC_OR_DEPARTMENT_OTHER)
Admission: EM | Admit: 2023-09-29 | Discharge: 2023-09-29 | Disposition: A | Discharge status: Home / Self Care | Payer: 59 | Attending: Emergency Medicine | Admitting: Emergency Medicine

## 2023-09-29 ENCOUNTER — Encounter (HOSPITAL_BASED_OUTPATIENT_CLINIC_OR_DEPARTMENT_OTHER): Payer: Self-pay | Admitting: Emergency Medicine

## 2023-09-29 ENCOUNTER — Emergency Department (HOSPITAL_BASED_OUTPATIENT_CLINIC_OR_DEPARTMENT_OTHER): Payer: 59 | Admitting: Radiology

## 2023-09-29 DIAGNOSIS — Y903 Blood alcohol level of 60-79 mg/100 ml: Secondary | ICD-10-CM | POA: Diagnosis not present

## 2023-09-29 DIAGNOSIS — F419 Anxiety disorder, unspecified: Secondary | ICD-10-CM | POA: Insufficient documentation

## 2023-09-29 DIAGNOSIS — R079 Chest pain, unspecified: Secondary | ICD-10-CM | POA: Insufficient documentation

## 2023-09-29 DIAGNOSIS — Z789 Other specified health status: Secondary | ICD-10-CM | POA: Diagnosis not present

## 2023-09-29 LAB — COMPREHENSIVE METABOLIC PANEL
ALT: 17 U/L (ref 0–44)
AST: 20 U/L (ref 15–41)
Albumin: 4.5 g/dL (ref 3.5–5.0)
Alkaline Phosphatase: 50 U/L (ref 38–126)
Anion gap: 12 (ref 5–15)
BUN: 7 mg/dL (ref 6–20)
CO2: 21 mmol/L — ABNORMAL LOW (ref 22–32)
Calcium: 8.7 mg/dL — ABNORMAL LOW (ref 8.9–10.3)
Chloride: 105 mmol/L (ref 98–111)
Creatinine, Ser: 0.67 mg/dL (ref 0.44–1.00)
GFR, Estimated: >60 mL/min (ref >60)
Glucose, Bld: 90 mg/dL (ref 70–99)
Potassium: 4 mmol/L (ref 3.5–5.1)
Sodium: 138 mmol/L (ref 135–145)
Total Bilirubin: 0.3 mg/dL (ref <1.2)
Total Protein: 7.4 g/dL (ref 6.5–8.1)

## 2023-09-29 LAB — LIPASE, BLOOD: Lipase: 23 U/L (ref 11–51)

## 2023-09-29 LAB — CBC
HCT: 44.6 % (ref 36.0–46.0)
Hemoglobin: 15.5 g/dL — ABNORMAL HIGH (ref 12.0–15.0)
MCH: 31.1 pg (ref 26.0–34.0)
MCHC: 34.8 g/dL (ref 30.0–36.0)
MCV: 89.4 fL (ref 80.0–100.0)
Platelets: 301 10*3/uL (ref 150–400)
RBC: 4.99 MIL/uL (ref 3.87–5.11)
RDW: 12.3 % (ref 11.5–15.5)
WBC: 10.9 10*3/uL — ABNORMAL HIGH (ref 4.0–10.5)
nRBC: 0 % (ref 0.0–0.2)

## 2023-09-29 LAB — ETHANOL: Alcohol, Ethyl (B): 75 mg/dL — ABNORMAL HIGH (ref <10)

## 2023-09-29 MED ORDER — FAMOTIDINE 20 MG PO TABS: 20.0000 mg | ORAL_TABLET | Freq: Two times a day (BID) | ORAL | 0 refills | Status: AC

## 2023-09-29 MED ORDER — FAMOTIDINE 20 MG PO TABS
40.0000 mg | ORAL_TABLET | Freq: Once | ORAL | Status: AC
  Administered 2023-09-29: 40 mg via ORAL
  Filled 2023-09-29: qty 2

## 2023-09-29 NOTE — ED Triage Notes (Signed)
Pt has been "out of it" per her friend.

## 2023-09-29 NOTE — ED Provider Notes (Signed)
[place] EMERGENCY DEPARTMENT AT [facility] Provider Note   CSN: [id number] Arrival date & time: 09/29/23  1318     History  No chief complaint on file.   Kristen Li is a 32 y.o. female.  HPI Patient has had chest pain on and off for over a month.  She reports this migratory.  Sometimes it is on the right sometimes on the left.  Usually towards the upper chest.  She will have episodes where she is pain-free.  But she reports she typically will have a few episodes a day.  We discussed triggers.  Patient reports that really seems to be triggered when she is most under stress.  He denies is particularly worse lying flat or with any exertion.  No lower extremity swelling or calf pain.  Patient typically inks coffee in the morning sometimes has something to eat.  She eats lunch at work from [employer] from [employer].  Patient occasionally has an episode of vomiting but reports that again is usually with a lot of stress.  Does not vomit after eating.  Patient comes in this morning because she was with her companion at home and they had been in the kitchen for a little while then went and sat on the porch to smoke a cigarette.  Her friend reports that the patient said she was feeling "out of it".  Then the patient briefly passed out and came around again without any change in mental status.  The patient did drink a lot of alcohol yesterday evening.  Estimated about an 18 pack of beer and 3 shots.  No other drug use.  Patient reports she drinks sporadically.  She does not drink on a daily basis.    Home Medications Prior to Admission medications   Medication Sig Start Date End Date Taking? Authorizing Provider  famotidine (PEPCID) 20 MG tablet Take 1 tablet (20 mg total) by mouth 2 (two) times daily. 09/29/23  Yes Arby Barrette, MD      Allergies    Patient has no known allergies.    Review of Systems   Review of Systems  Physical Exam Updated Vital Signs BP  112/75   Pulse 65   Temp 97.9 F (36.6 C)   Resp 16   Ht 5\' 3"  (1.6 m)   Wt 77.1 kg   SpO2 97%   BMI 30.11 kg/m  Physical Exam Constitutional:      Comments: Alert nontoxic clinically well in appearance.  Well-nourished well-developed.  HENT:     Head: Normocephalic and atraumatic.     Mouth/Throat:     Mouth: Mucous membranes are moist.     Pharynx: Oropharynx is clear.  Eyes:     Extraocular Movements: Extraocular movements intact.  Cardiovascular:     Rate and Rhythm: Normal rate and regular rhythm.     Heart sounds: Normal heart sounds.  Pulmonary:     Effort: Pulmonary effort is normal.     Breath sounds: Normal breath sounds.  Abdominal:     General: There is no distension.     Palpations: Abdomen is soft.     Tenderness: There is no abdominal tenderness. There is no guarding.  Musculoskeletal:        General: No swelling or tenderness. Normal range of motion.     Right lower leg: No edema.     Left lower leg: No edema.  Skin:    General: Skin is warm and dry.  Neurological:  General: No focal deficit present.     Mental Status: She is oriented to person, place, and time.     Motor: No weakness.     Coordination: Coordination normal.     ED Results / Procedures / Treatments   Labs (all labs ordered are listed, but only abnormal results are displayed) Labs Reviewed  COMPREHENSIVE METABOLIC PANEL - Abnormal; Notable for the following components:      Result Value   CO2 21 (*)    Calcium 8.7 (*)    All other components within normal limits  CBC - Abnormal; Notable for the following components:   WBC 10.9 (*)    Hemoglobin 15.5 (*)    All other components within normal limits  ETHANOL - Abnormal; Notable for the following components:   Alcohol, Ethyl (B) 75 (*)    All other components within normal limits  LIPASE, BLOOD    EKG EKG Interpretation Date/Time:  Sunday September 29 2023 13:29:34 EST Ventricular Rate:  94 PR Interval:  140 QRS  Duration:  78 QT Interval:  342 QTC Calculation: 427 R Axis:   64  Text Interpretation: Normal sinus rhythm Normal ECG When compared with ECG of 20-Nov-2012 21:33, No significant change was found early repolarization seen on earlier tracings, no ischemic appearance . Confirmed by Arby Barrette [phone]) on 09/29/2023 3:38:23 PM  Radiology DG Chest 2 View Result Date: 09/29/2023 CLINICAL DATA:  Chest pain EXAM: CHEST - 2 VIEW COMPARISON:  10/28/2013 FINDINGS: The heart size and mediastinal contours are within normal limits. Both lungs are clear. The visualized skeletal structures are unremarkable. IMPRESSION: Normal chest radiographs. Electronically Signed   By: Duanne Guess D.O.   On: 09/29/2023 16:50    Procedures Procedures    Medications Ordered in ED Medications  famotidine (PEPCID) tablet 40 mg (40 mg Oral Given 09/29/23 1610)    ED Course/ Medical Decision Making/ A&P                                 Medical Decision Making Amount and/or Complexity of Data Reviewed Labs: ordered. Radiology: ordered.   Patient has been experiencing  atypical pattern of chest pain for a month.  No associated lower extremity swelling or calf pain.  No tachycardia no hypoxia.  At this time I have low suspicion for PE.  Patient has chest pain is migratory and often typically provoked by anxiety.  Not provoked by exertion.  Patient does have binging alcohol pattern and smokes.  She also drinks coffee and has irregular dietary pattern.  These may be contributing to gastritis and GERD.  Will empirically give trial of Pepcid.  Follow waiting results.  Will obtain basic lab work and chest x-ray.  EKG reviewed by myself shows no acute changes from prior studies.  No acute ischemic pattern or dysrhythmia.  At this point with normal EKG and atypical symptoms with low risk factors for ACS, have low suspicion for ACS as etiology of chest pain.  Lipase normal and LFTs normal.  No indication at this  time of pancreatitis or alcoholic hepatitis.  Chest x-ray clear reviewed by radiology no pneumothorax, no pneumonia.  Patient had large consumption of alcohol last night which I feel was contributing to likely vagal or orthostatic event that was temporary today and resolved within a minute back to normal mental status.  Regarding patient's sporadic chest pain as this may be associated with anxiety or possibly  gastritis or reflux symptoms as I feel patient a significant risk for these conditions.  Will empirically start Pepcid twice daily and recommend very close follow-up with PCP.  We also reviewed options in the community for seeking additional treatment for anxiety and life stressors.  Patient voices understanding.        Final Clinical Impression(s) / ED Diagnoses Final diagnoses:  Chest pain, unspecified type  Anxiety  Episode of binge consumption of alcohol    Rx / DC Orders ED Discharge Orders          Ordered    famotidine (PEPCID) 20 MG tablet  2 times daily        09/29/23 1709              Arby Barrette, MD 09/29/23 1734

## 2023-09-29 NOTE — Discharge Instructions (Addendum)
1.  Continue to work with your primary care provider to get treatment for severe anxiety.  You may also go to [facility].  Location and information is included in your discharge instructions.  You have also been given a resource guide for local facilities for counseling and treatment. 2.  Your chest pain may be coming from reflux or anxiety.  Reviewed the instructions for these 2 conditions.  Try to make dietary changes and start taking Pepcid twice a day to see if this improves your symptoms. 3.  Return to the emergency department immediately if you have suddenly worsening or changing pattern of chest pain, fevers, coughing up blood or shortness of breath.

## 2023-09-29 NOTE — ED Triage Notes (Signed)
Last night had about 3 shots and around 18 pack. Pt drinks an 18 pack beer every weekend. No drugs

## 2023-09-29 NOTE — ED Triage Notes (Signed)
Chest pain since the 24th due to stress, has hx anxiety. Passed out today around noon for a minute .

## 2023-12-09 ENCOUNTER — Ambulatory Visit
Admission: EM | Admit: 2023-12-09 | Discharge: 2023-12-09 | Disposition: A | Discharge status: Home / Self Care | Payer: 59 | Attending: Family Medicine | Admitting: Family Medicine

## 2023-12-09 DIAGNOSIS — J988 Other specified respiratory disorders: Secondary | ICD-10-CM | POA: Diagnosis not present

## 2023-12-09 DIAGNOSIS — B9789 Other viral agents as the cause of diseases classified elsewhere: Secondary | ICD-10-CM

## 2023-12-09 LAB — POC COVID19/FLU A&B COMBO
Covid Antigen, POC: NEGATIVE
Influenza A Antigen, POC: NEGATIVE
Influenza B Antigen, POC: NEGATIVE

## 2023-12-09 MED ORDER — PROMETHAZINE-DM 6.25-15 MG/5ML PO SYRP: 5.0000 mL | ORAL_SOLUTION | Freq: Three times a day (TID) | ORAL | 0 refills | Status: AC | PRN

## 2023-12-09 MED ORDER — PREDNISONE 10 MG PO TABS: 30.0000 mg | ORAL_TABLET | Freq: Every day | ORAL | 0 refills | Status: AC

## 2023-12-09 MED ORDER — CETIRIZINE HCL 10 MG PO TABS: 10.0000 mg | ORAL_TABLET | Freq: Every day | ORAL | 0 refills | Status: AC

## 2023-12-09 NOTE — Discharge Instructions (Signed)
 We will manage this as a viral syndrome. For sore throat or cough try using a honey-based tea. Use 3 teaspoons of honey with juice squeezed from half lemon. Place shaved pieces of ginger into 1/2-1 cup of water and warm over stove top. Then mix the ingredients and repeat every 4 hours as needed. Please take Tylenol 500mg -650mg  once every 6 hours for fevers, aches and pains. Hydrate very well with at least 2 liters (64 ounces) of water. Eat light meals such as soups (chicken and noodles, chicken wild rice, vegetable).  Do not eat any foods that you are allergic to.  Start an antihistamine like Zyrtec (10mg  daily) for postnasal drainage, sinus congestion.  You can take this together with prednisone, the steroid to help clear you up in your lungs. Use cough syrup as needed.

## 2023-12-09 NOTE — ED Provider Notes (Signed)
 [facility]  Note:  This document was prepared using Conservation officer, historic buildings and may include unintentional dictation errors.  MRN: [id number] DOB: [birth year]  Subjective:   Kristen Li is a 33 y.o. female presenting for 2 day history of acute onset productive cough that elicits chest pain, throat pain, headaches, hoarseness. No fever, shob, wheezing. Quit smoking about 3 months ago.   No current facility-administered medications for this encounter.  Current Outpatient Medications:    Phenylephrine-Pheniramine-DM (THERAFLU COLD & COUGH PO), Take by mouth., Disp: , Rfl:    Zinc Sulfate (ZINC 15 PO), Take by mouth., Disp: , Rfl:    famotidine (PEPCID) 20 MG tablet, Take 1 tablet (20 mg total) by mouth 2 (two) times daily., Disp: 30 tablet, Rfl: 0   No Known Allergies  History reviewed. No pertinent past medical history.   History reviewed. No pertinent surgical history.  History reviewed. No pertinent family history.  Social History   Tobacco Use   Smoking status: Every Day    Current packs/day: 0.50    Types: Cigarettes   Smokeless tobacco: Current  Vaping Use   Vaping status: Never Used  Substance Use Topics   Alcohol use: Yes    Comment: socially   Drug use: Yes    Types: Marijuana    ROS   Objective:   Vitals: BP (!) 136/93 (BP Location: Right Arm)   Pulse 75   Temp 99.4 F (37.4 C) (Oral)   Resp 18   LMP 11/16/2023 (Exact Date)   SpO2 98%   Physical Exam Constitutional:      General: She is not in acute distress.    Appearance: Normal appearance. She is well-developed and normal weight. She is not ill-appearing, toxic-appearing or diaphoretic.  HENT:     Head: Normocephalic and atraumatic.     Right Ear: Tympanic membrane, ear canal and external ear normal. No drainage or tenderness. No middle ear effusion. There is no impacted cerumen. Tympanic membrane is not erythematous or bulging.     Left Ear: Tympanic membrane,  ear canal and external ear normal. No drainage or tenderness.  No middle ear effusion. There is no impacted cerumen. Tympanic membrane is not erythematous or bulging.     Nose: Congestion present. No rhinorrhea.     Mouth/Throat:     Mouth: Mucous membranes are moist. No oral lesions.     Pharynx: No pharyngeal swelling, oropharyngeal exudate, posterior oropharyngeal erythema or uvula swelling.     Tonsils: No tonsillar exudate or tonsillar abscesses.     Comments: Hoarseness of voice noted.  Eyes:     General: No scleral icterus.       Right eye: No discharge.        Left eye: No discharge.     Extraocular Movements: Extraocular movements intact.     Right eye: Normal extraocular motion.     Left eye: Normal extraocular motion.     Conjunctiva/sclera: Conjunctivae normal.  Cardiovascular:     Rate and Rhythm: Normal rate and regular rhythm.     Heart sounds: Normal heart sounds. No murmur heard.    No friction rub. No gallop.  Pulmonary:     Effort: Pulmonary effort is normal. No respiratory distress.     Breath sounds: No stridor. No wheezing, rhonchi or rales.  Chest:     Chest wall: No tenderness.  Musculoskeletal:     Cervical back: Normal range of motion and neck supple.  Lymphadenopathy:  Cervical: No cervical adenopathy.  Skin:    General: Skin is warm and dry.  Neurological:     General: No focal deficit present.     Mental Status: She is alert and oriented to person, place, and time.  Psychiatric:        Mood and Affect: Mood normal.        Behavior: Behavior normal.    Results for orders placed or performed during the hospital encounter of 12/09/23 (from the past 24 hours)  POC Covid + Flu A/B Antigen     Status: Normal   Collection Time: 12/09/23  9:50 AM  Result Value Ref Range   Influenza A Antigen, POC Negative    Influenza B Antigen, POC Negative    Covid Antigen, POC Negative     Assessment and Plan :   PDMP not reviewed this encounter.  1. Viral  respiratory infection    Offered prednisone to help with her significant lower respiratory symptoms.  Use supportive care otherwise for viral respiratory infection. Deferred imaging given clear cardiopulmonary exam, hemodynamically stable vital signs. Counseled patient on potential for adverse effects with medications prescribed/recommended today, ER and return-to-clinic precautions discussed, patient verbalized understanding.    Wallis Bamberg, [place] 12/09/23 [phone]

## 2023-12-09 NOTE — ED Triage Notes (Signed)
 Pt reports cough, sore throat and headache x 2 days. Theraflu and zinc gives some relief.
